# Patient Record
Sex: Female | Born: 1980 | Race: Black or African American | Hispanic: No | Marital: Single | Smoking: Former smoker
Health system: Southern US, Community
[De-identification: ages and names within clinical notes are randomized; demographics above are authoritative.]

## PROBLEM LIST (undated history)

## (undated) DIAGNOSIS — I10 Essential (primary) hypertension: Secondary | ICD-10-CM

## (undated) DIAGNOSIS — K5732 Diverticulitis of large intestine without perforation or abscess without bleeding: Secondary | ICD-10-CM

## (undated) DIAGNOSIS — K5720 Diverticulitis of large intestine with perforation and abscess without bleeding: Secondary | ICD-10-CM

## (undated) DIAGNOSIS — K5792 Diverticulitis of intestine, part unspecified, without perforation or abscess without bleeding: Secondary | ICD-10-CM

## (undated) HISTORY — DX: Diverticulitis of large intestine with perforation and abscess without bleeding: K57.20

## (undated) HISTORY — DX: Diverticulitis of intestine, part unspecified, without perforation or abscess without bleeding: K57.92

## (undated) HISTORY — DX: Diverticulitis of large intestine without perforation or abscess without bleeding: K57.32

## (undated) HISTORY — PX: OTHER SURGICAL HISTORY: SHX169

---

## 2017-03-23 DIAGNOSIS — K5792 Diverticulitis of intestine, part unspecified, without perforation or abscess without bleeding: Secondary | ICD-10-CM

## 2017-03-23 HISTORY — DX: Diverticulitis of intestine, part unspecified, without perforation or abscess without bleeding: K57.92

## 2017-04-24 ENCOUNTER — Encounter: Payer: Self-pay | Admitting: Emergency Medicine

## 2017-04-24 ENCOUNTER — Other Ambulatory Visit: Payer: Self-pay

## 2017-04-24 ENCOUNTER — Emergency Department
Admission: EM | Admit: 2017-04-24 | Discharge: 2017-04-24 | Disposition: A | Discharge status: Home / Self Care | Payer: Medicaid Other | Attending: Emergency Medicine | Admitting: Emergency Medicine

## 2017-04-24 ENCOUNTER — Emergency Department: Payer: Medicaid Other

## 2017-04-24 DIAGNOSIS — I10 Essential (primary) hypertension: Secondary | ICD-10-CM | POA: Insufficient documentation

## 2017-04-24 DIAGNOSIS — R42 Dizziness and giddiness: Secondary | ICD-10-CM

## 2017-04-24 DIAGNOSIS — F17210 Nicotine dependence, cigarettes, uncomplicated: Secondary | ICD-10-CM | POA: Diagnosis not present

## 2017-04-24 DIAGNOSIS — Z79899 Other long term (current) drug therapy: Secondary | ICD-10-CM | POA: Insufficient documentation

## 2017-04-24 LAB — BASIC METABOLIC PANEL
ANION GAP: 9 (ref 5–15)
BUN: 16 mg/dL (ref 6–20)
CALCIUM: 9 mg/dL (ref 8.9–10.3)
CHLORIDE: 104 mmol/L (ref 101–111)
CO2: 23 mmol/L (ref 22–32)
Creatinine, Ser: 0.88 mg/dL (ref 0.44–1.00)
GFR calc non Af Amer: >60 mL/min (ref >60)
GLUCOSE: 118 mg/dL — AB (ref 65–99)
POTASSIUM: 3.9 mmol/L (ref 3.5–5.1)
Sodium: 136 mmol/L (ref 135–145)

## 2017-04-24 LAB — CBC
HEMATOCRIT: 44.5 % (ref 35.0–47.0)
HEMOGLOBIN: 14.6 g/dL (ref 12.0–16.0)
MCH: 29.4 pg (ref 26.0–34.0)
MCHC: 32.8 g/dL (ref 32.0–36.0)
MCV: 89.5 fL (ref 80.0–100.0)
Platelets: 272 10*3/uL (ref 150–440)
RBC: 4.97 MIL/uL (ref 3.80–5.20)
RDW: 13.6 % (ref 11.5–14.5)
WBC: 6.8 10*3/uL (ref 3.6–11.0)

## 2017-04-24 MED ORDER — AMLODIPINE BESYLATE 5 MG PO TABS
5.0000 mg | ORAL_TABLET | Freq: Once | ORAL | Status: AC
  Administered 2017-04-24: 5 mg via ORAL
  Filled 2017-04-24: qty 1

## 2017-04-24 MED ORDER — HYDROCHLOROTHIAZIDE 25 MG PO TABS: 25.0000 mg | ORAL_TABLET | Freq: Every day | ORAL | 1 refills | Status: DC

## 2017-04-24 MED ORDER — HYDROCHLOROTHIAZIDE 25 MG PO TABS
25.0000 mg | ORAL_TABLET | Freq: Once | ORAL | Status: AC
  Administered 2017-04-24: 25 mg via ORAL
  Filled 2017-04-24 (×2): qty 1

## 2017-04-24 MED ORDER — AMLODIPINE BESYLATE 5 MG PO TABS: 5.0000 mg | ORAL_TABLET | Freq: Every day | ORAL | 1 refills | Status: DC

## 2017-04-24 NOTE — ED Provider Notes (Signed)
[facility] Emergency Department Provider Note   ____________________________________________    I have reviewed the triage vital signs and the nursing notes.   HISTORY  Chief Complaint Dizziness     HPI Meredith Myers is a 37 y.o. female who presents with complaints of dizziness.  Patient reports she has felt mildly lightheaded over the past 4 days.  She denies palpitations or chest pain.  No shortness of breath.  Intermittent mild headaches.  No neuro deficits.  No fevers or chills.  No calf pain or swelling.  No recent travel.  No pleurisy.  No history of arrhythmia.   History reviewed. No pertinent past medical history.  There are no active problems to display for this patient.   Past Surgical History:  Procedure Laterality Date  . ablation of uterus      Prior to Admission medications   Medication Sig Start Date End Date Taking? Authorizing Provider  amLODipine (NORVASC) 5 MG tablet Take 1 tablet (5 mg total) by mouth daily. 04/24/17 06/23/17  Jene Every, MD  hydrochlorothiazide (HYDRODIURIL) 25 MG tablet Take 1 tablet (25 mg total) by mouth daily. 04/24/17   Jene Every, MD     Allergies Patient has no known allergies.  History reviewed. No pertinent family history.  Social History Social History   Tobacco Use  . Smoking status: Current Every Day Smoker  . Smokeless tobacco: Never Used  Substance Use Topics  . Alcohol use: No    Frequency: Never  . Drug use: No    Review of Systems  Constitutional: No fevers, dizziness as above Eyes: No visual changes.  ENT: No sore throat. Cardiovascular: Denies chest pain. Respiratory: Denies shortness of breath. Gastrointestinal  No nausea, no vomiting.   Genitourinary: Negative for dysuria. Musculoskeletal: Negative for back pain. Skin: Negative for rash. Neurological: Mild headache, no weakness   ____________________________________________   PHYSICAL EXAM:  VITAL  SIGNS: ED Triage Vitals  Enc Vitals Group     BP 04/24/17 1701 (!) 176/107     Pulse Rate 04/24/17 1701 99     Resp 04/24/17 1701 18     Temp 04/24/17 1701 98.7 F (37.1 C)     Temp Source 04/24/17 1701 Oral     SpO2 04/24/17 1701 97 %     Weight 04/24/17 1702 129.3 kg (285 lb)     Height 04/24/17 1702 1.6 m (5\' 3" )     Head Circumference --      Peak Flow --      Pain Score 04/24/17 2018 6     Pain Loc --      Pain Edu? --      Excl. in GC? --     Constitutional: Alert and oriented. No acute distress. Pleasant and interactive Eyes: Conjunctivae are normal.  PERRLA Nose: No congestion/rhinnorhea. Mouth/Throat: Mucous membranes are moist.    Cardiovascular: Normal rate, regular rhythm. Grossly normal heart sounds.  Good peripheral circulation. Respiratory: Normal respiratory effort.  No retractions. Lungs CTAB. Gastrointestinal: Soft and nontender. No distention. . Genitourinary: deferred Musculoskeletal:   Warm and well perfused Neurologic:  Normal speech and language. No gross focal neurologic deficits are appreciated.  Skin:  Skin is warm, dry and intact. No rash noted. Psychiatric: Mood and affect are normal. Speech and behavior are normal.  ____________________________________________   LABS (all labs ordered are listed, but only abnormal results are displayed)  Labs Reviewed  BASIC METABOLIC PANEL - Abnormal; Notable for the following components:  Result Value   Glucose, Bld 118 (*)    All other components within normal limits  CBC  URINALYSIS, COMPLETE (UACMP) WITH MICROSCOPIC  POC URINE PREG, ED   ____________________________________________  EKG  ED ECG REPORT I, Jene Everyobert Renly Guedes, the attending physician, personally viewed and interpreted this ECG.  Date: 04/24/2017  Rhythm: normal sinus rhythm QRS Axis: normal Intervals: normal ST/T Wave abnormalities: normal Narrative Interpretation: no evidence of acute  ischemia  ____________________________________________  RADIOLOGY  CT head unremarkable ____________________________________________   PROCEDURES  Procedure(s) performed: No  Procedures   Critical Care performed: No ____________________________________________   INITIAL IMPRESSION / ASSESSMENT AND PLAN / ED COURSE  Pertinent labs & imaging results that were available during my care of the patient were reviewed by me and considered in my medical decision making (see chart for details).  Patient well-appearing in no acute distress.  EKG unremarkable.  Labs are unremarkable.  Elevated blood pressure may be causing some of her symptoms.  Patient does not have a history of high blood pressure.  We will start the patient on dual therapy for her blood pressure and have her follow-up closely with PCP for adjustment/further evaluation.    ____________________________________________   FINAL CLINICAL IMPRESSION(S) / ED DIAGNOSES  Final diagnoses:  Dizziness  Essential hypertension        Note:  This document was prepared using Dragon voice recognition software and may include unintentional dictation errors.    Jene EveryKinner, Micki Cassel, MD 04/24/17 [phone]

## 2017-04-24 NOTE — ED Triage Notes (Signed)
Acute onset of feeling lightheaded and headache 3 days and has not went completely away.  Feels increase in lightheaded when stands. No pain other than headache which is to posterior of head.  Took motrin and some relief but comes back.

## 2017-04-24 NOTE — ED Notes (Signed)
ED Provider at bedside. 

## 2017-05-04 ENCOUNTER — Other Ambulatory Visit: Payer: Self-pay

## 2017-05-04 ENCOUNTER — Inpatient Hospital Stay
Admission: EM | Admit: 2017-05-04 | Discharge: 2017-05-07 | DRG: 392 | Disposition: A | Discharge status: Home / Self Care | Payer: Medicaid Other | Attending: Surgery | Admitting: Surgery

## 2017-05-04 ENCOUNTER — Emergency Department: Payer: Medicaid Other

## 2017-05-04 ENCOUNTER — Encounter: Payer: Self-pay | Admitting: Emergency Medicine

## 2017-05-04 DIAGNOSIS — R109 Unspecified abdominal pain: Secondary | ICD-10-CM

## 2017-05-04 DIAGNOSIS — K5732 Diverticulitis of large intestine without perforation or abscess without bleeding: Secondary | ICD-10-CM | POA: Diagnosis not present

## 2017-05-04 DIAGNOSIS — K5792 Diverticulitis of intestine, part unspecified, without perforation or abscess without bleeding: Secondary | ICD-10-CM | POA: Diagnosis not present

## 2017-05-04 DIAGNOSIS — I10 Essential (primary) hypertension: Secondary | ICD-10-CM | POA: Diagnosis present

## 2017-05-04 DIAGNOSIS — Z79899 Other long term (current) drug therapy: Secondary | ICD-10-CM

## 2017-05-04 DIAGNOSIS — Z87891 Personal history of nicotine dependence: Secondary | ICD-10-CM | POA: Diagnosis not present

## 2017-05-04 HISTORY — DX: Essential (primary) hypertension: I10

## 2017-05-04 LAB — URINALYSIS, COMPLETE (UACMP) WITH MICROSCOPIC
BACTERIA UA: NONE SEEN
Bilirubin Urine: NEGATIVE
Glucose, UA: NEGATIVE mg/dL
HGB URINE DIPSTICK: NEGATIVE
KETONES UR: 5 mg/dL — AB
Nitrite: NEGATIVE
PROTEIN: 30 mg/dL — AB
Specific Gravity, Urine: 1.015 (ref 1.005–1.030)
pH: 5 (ref 5.0–8.0)

## 2017-05-04 LAB — COMPREHENSIVE METABOLIC PANEL
ALBUMIN: 4.1 g/dL (ref 3.5–5.0)
ALT: 15 U/L (ref 14–54)
ANION GAP: 11 (ref 5–15)
AST: 21 U/L (ref 15–41)
Alkaline Phosphatase: 56 U/L (ref 38–126)
BUN: 11 mg/dL (ref 6–20)
CHLORIDE: 96 mmol/L — AB (ref 101–111)
CO2: 27 mmol/L (ref 22–32)
Calcium: 9.9 mg/dL (ref 8.9–10.3)
Creatinine, Ser: 1.33 mg/dL — ABNORMAL HIGH (ref 0.44–1.00)
GFR calc Af Amer: 59 mL/min — ABNORMAL LOW (ref >60)
GFR calc non Af Amer: 51 mL/min — ABNORMAL LOW (ref >60)
GLUCOSE: 131 mg/dL — AB (ref 65–99)
POTASSIUM: 3.5 mmol/L (ref 3.5–5.1)
SODIUM: 134 mmol/L — AB (ref 135–145)
Total Bilirubin: 1 mg/dL (ref 0.3–1.2)
Total Protein: 8.9 g/dL — ABNORMAL HIGH (ref 6.5–8.1)

## 2017-05-04 LAB — CBC
HEMATOCRIT: 47.1 % — AB (ref 35.0–47.0)
HEMOGLOBIN: 15.5 g/dL (ref 12.0–16.0)
MCH: 29.1 pg (ref 26.0–34.0)
MCHC: 32.8 g/dL (ref 32.0–36.0)
MCV: 88.6 fL (ref 80.0–100.0)
Platelets: 312 10*3/uL (ref 150–440)
RBC: 5.32 MIL/uL — ABNORMAL HIGH (ref 3.80–5.20)
RDW: 13.5 % (ref 11.5–14.5)
WBC: 15 10*3/uL — ABNORMAL HIGH (ref 3.6–11.0)

## 2017-05-04 LAB — WET PREP, GENITAL
Sperm: NONE SEEN
Trich, Wet Prep: NONE SEEN
Yeast Wet Prep HPF POC: NONE SEEN

## 2017-05-04 LAB — CHLAMYDIA/NGC RT PCR (ARMC ONLY)
CHLAMYDIA TR: NOT DETECTED
N GONORRHOEAE: NOT DETECTED

## 2017-05-04 LAB — POCT PREGNANCY, URINE: PREG TEST UR: NEGATIVE

## 2017-05-04 LAB — LIPASE, BLOOD: LIPASE: 22 U/L (ref 11–51)

## 2017-05-04 MED ORDER — PIPERACILLIN-TAZOBACTAM 3.375 G IVPB 30 MIN
3.3750 g | Freq: Once | INTRAVENOUS | Status: AC
  Administered 2017-05-04: 3.375 g via INTRAVENOUS

## 2017-05-04 MED ORDER — KETOROLAC TROMETHAMINE 30 MG/ML IJ SOLN
INTRAMUSCULAR | Status: AC
  Administered 2017-05-04: 30 mg via INTRAVENOUS
  Filled 2017-05-04: qty 1

## 2017-05-04 MED ORDER — ENOXAPARIN SODIUM 40 MG/0.4ML ~~LOC~~ SOLN
40.0000 mg | Freq: Two times a day (BID) | SUBCUTANEOUS | Status: DC
  Administered 2017-05-04 – 2017-05-05 (×2): 40 mg via SUBCUTANEOUS
  Filled 2017-05-04 (×4): qty 0.4

## 2017-05-04 MED ORDER — ONDANSETRON HCL 4 MG/2ML IJ SOLN
4.0000 mg | Freq: Once | INTRAMUSCULAR | Status: AC
  Administered 2017-05-04: 4 mg via INTRAVENOUS

## 2017-05-04 MED ORDER — MORPHINE SULFATE (PF) 4 MG/ML IV SOLN
INTRAVENOUS | Status: AC
  Administered 2017-05-04: 4 mg via INTRAVENOUS
  Filled 2017-05-04: qty 1

## 2017-05-04 MED ORDER — PIPERACILLIN-TAZOBACTAM 3.375 G IVPB
INTRAVENOUS | Status: AC
  Administered 2017-05-04: 3.375 g via INTRAVENOUS
  Filled 2017-05-04: qty 50

## 2017-05-04 MED ORDER — HYDRALAZINE HCL 20 MG/ML IJ SOLN: 10.0000 mg | Freq: Four times a day (QID) | INTRAMUSCULAR | Status: DC | PRN

## 2017-05-04 MED ORDER — KETOROLAC TROMETHAMINE 30 MG/ML IJ SOLN
30.0000 mg | Freq: Four times a day (QID) | INTRAMUSCULAR | Status: DC
  Administered 2017-05-04 – 2017-05-07 (×12): 30 mg via INTRAVENOUS
  Filled 2017-05-04 (×10): qty 1

## 2017-05-04 MED ORDER — HYDROMORPHONE HCL 1 MG/ML IJ SOLN
0.5000 mg | INTRAMUSCULAR | Status: DC | PRN
  Administered 2017-05-04 – 2017-05-05 (×2): 0.5 mg via INTRAVENOUS
  Filled 2017-05-04 (×3): qty 0.5

## 2017-05-04 MED ORDER — LACTATED RINGERS IV SOLN
125.0000 mL/h | INTRAVENOUS | Status: DC
  Administered 2017-05-05 – 2017-05-06 (×3): 125 mL/h via INTRAVENOUS

## 2017-05-04 MED ORDER — SODIUM CHLORIDE 0.9 % IV BOLUS (SEPSIS)
1000.0000 mL | Freq: Once | INTRAVENOUS | Status: AC
  Administered 2017-05-04: 1000 mL via INTRAVENOUS

## 2017-05-04 MED ORDER — MORPHINE SULFATE (PF) 4 MG/ML IV SOLN
4.0000 mg | Freq: Once | INTRAVENOUS | Status: AC
  Administered 2017-05-04: 4 mg via INTRAVENOUS

## 2017-05-04 MED ORDER — IOPAMIDOL (ISOVUE-300) INJECTION 61%
100.0000 mL | Freq: Once | INTRAVENOUS | Status: AC | PRN
  Administered 2017-05-04: 100 mL via INTRAVENOUS
  Filled 2017-05-04: qty 100

## 2017-05-04 MED ORDER — PIPERACILLIN-TAZOBACTAM 3.375 G IVPB
3.3750 g | Freq: Three times a day (TID) | INTRAVENOUS | Status: DC
  Administered 2017-05-05 – 2017-05-06 (×4): 3.375 g via INTRAVENOUS
  Filled 2017-05-04 (×4): qty 50

## 2017-05-04 MED ORDER — ONDANSETRON HCL 4 MG/2ML IJ SOLN
INTRAMUSCULAR | Status: AC
  Administered 2017-05-04: 4 mg via INTRAVENOUS
  Filled 2017-05-04: qty 2

## 2017-05-04 MED ORDER — ONDANSETRON 4 MG PO TBDP: 4.0000 mg | ORAL_TABLET | Freq: Four times a day (QID) | ORAL | Status: DC | PRN

## 2017-05-04 MED ORDER — ONDANSETRON HCL 4 MG/2ML IJ SOLN: 4.0000 mg | Freq: Four times a day (QID) | INTRAMUSCULAR | Status: DC | PRN

## 2017-05-04 MED ORDER — IOPAMIDOL (ISOVUE-300) INJECTION 61%
30.0000 mL | Freq: Once | INTRAVENOUS | Status: AC
  Administered 2017-05-04: 30 mL via ORAL
  Filled 2017-05-04: qty 30

## 2017-05-04 MED ORDER — PANTOPRAZOLE SODIUM 40 MG IV SOLR
40.0000 mg | Freq: Every day | INTRAVENOUS | Status: DC
  Administered 2017-05-04 – 2017-05-06 (×3): 40 mg via INTRAVENOUS
  Filled 2017-05-04 (×4): qty 40

## 2017-05-04 NOTE — ED Provider Notes (Signed)
CT results IMPRESSION: 1. Acute proximal sigmoid diverticulitis with extensive surrounding inflammatory stranding. There is a small (0.4 cubic cm) collection of gas along the anterior inferior margin of the involved segment which could be within an inflamed diverticulum but could also be a small contained extraluminal collection of gas. There is no overt free intraperitoneal gas.  Patient has sigmoid diverticulitis with possible focal perforation.  I have ordered IV Zosyn and will discuss with general surgeon for admission.    Emily FilbertWilliams, Jonathan E, MD 05/04/17 [phone]

## 2017-05-04 NOTE — ED Provider Notes (Signed)
[facility] Emergency Department Provider Note ____________________________________________   I have reviewed the triage vital signs and the triage nursing note.  HISTORY  Chief Complaint Abdominal Pain   Historian Patient  HPI Meredith Myers is a 37 y.o. female presenting with lower abdominal pain, although it also feels diffuse.  Somewhat more so in the suprapubic area without specific dysuria or hematuria.  Mild nausea without vomiting.  patient diarrhea black or bloody stools.  No chest pain or trouble breathing.  No fevers.  Pain is moderate to severe.  Nothing makes it worse or better.  Pain is actually been there mildly progressing for about a week or so.  History of endometrial ablation.  Denies vaginal discharge.   Past Medical History:  Diagnosis Date  . Hypertension     There are no active problems to display for this patient.   Past Surgical History:  Procedure Laterality Date  . ablation of uterus      Prior to Admission medications   Medication Sig Start Date End Date Taking? Authorizing Provider  amLODipine (NORVASC) 5 MG tablet Take 1 tablet (5 mg total) by mouth daily. 04/24/17 06/23/17  Jene EveryKinner, Robert, MD  hydrochlorothiazide (HYDRODIURIL) 25 MG tablet Take 1 tablet (25 mg total) by mouth daily. 04/24/17   Jene EveryKinner, Robert, MD    No Known Allergies  No family history on file.  Social History Social History   Tobacco Use  . Smoking status: Former Smoker    Types: Cigarettes  . Smokeless tobacco: Never Used  Substance Use Topics  . Alcohol use: No    Frequency: Never  . Drug use: No    Review of Systems  Constitutional: Negative for fever. Eyes: Negative for visual changes. ENT: Negative for sore throat. Cardiovascular: Negative for chest pain. Respiratory: Negative for shortness of breath. Gastrointestinal: Positive for abdominal pain as per HPI. Genitourinary: Negative for dysuria. Musculoskeletal: Negative for  back pain. Skin: Negative for rash. Neurological: Negative for headache.  ____________________________________________   PHYSICAL EXAM:  VITAL SIGNS: ED Triage Vitals  Enc Vitals Group     BP 05/04/17 1307 126/87     Pulse Rate 05/04/17 1307 (!) 124     Resp 05/04/17 1307 18     Temp 05/04/17 1307 98.1 F (36.7 C)     Temp Source 05/04/17 1307 Oral     SpO2 05/04/17 1307 98 %     Weight 05/04/17 1312 285 lb (129.3 kg)     Height 05/04/17 1312 5\' 3"  (1.6 m)     Head Circumference --      Peak Flow --      Pain Score 05/04/17 1312 7     Pain Loc --      Pain Edu? --      Excl. in GC? --      Constitutional: Alert and oriented. Well appearing and in no distress. HEENT   Head: Normocephalic and atraumatic.      Eyes: Conjunctivae are normal. Pupils equal and round.       Ears:         Nose: No congestion/rhinnorhea.   Mouth/Throat: Mucous membranes are moist.   Neck: No stridor. Cardiovascular/Chest: Normal rate, regular rhythm.  No murmurs, rubs, or gallops. Respiratory: Normal respiratory effort without tachypnea nor retractions. Breath sounds are clear and equal bilaterally. No wheezes/rales/rhonchi. Gastrointestinal: Soft. No distention, no guarding, no rebound.  Acutely tender lower greater than upper without focal McBurney's point tenderness. Genitourinary/rectal:  White vaginal discharge  without cervical motion tenderness or adnexal mass or tenderness. Musculoskeletal: Nontender with normal range of motion in all extremities. No joint effusions.  No lower extremity tenderness.  No edema. Neurologic:  Normal speech and language. No gross or focal neurologic deficits are appreciated. Skin:  Skin is warm, dry and intact. No rash noted. Psychiatric: Mood and affect are normal. Speech and behavior are normal. Patient exhibits appropriate insight and judgment.   ____________________________________________  LABS (pertinent positives/negatives) I, Governor Rooks,  MD the attending physician have reviewed the labs noted below.  Labs Reviewed  COMPREHENSIVE METABOLIC PANEL - Abnormal; Notable for the following components:      Result Value   Sodium 134 (*)    Chloride 96 (*)    Glucose, Bld 131 (*)    Creatinine, Ser 1.33 (*)    Total Protein 8.9 (*)    GFR calc non Af Amer 51 (*)    GFR calc Af Amer 59 (*)    All other components within normal limits  CBC - Abnormal; Notable for the following components:   WBC 15.0 (*)    RBC 5.32 (*)    HCT 47.1 (*)    All other components within normal limits  URINALYSIS, COMPLETE (UACMP) WITH MICROSCOPIC - Abnormal; Notable for the following components:   Color, Urine AMBER (*)    APPearance HAZY (*)    Ketones, ur 5 (*)    Protein, ur 30 (*)    Leukocytes, UA TRACE (*)    Squamous Epithelial / LPF 0-5 (*)    All other components within normal limits  CHLAMYDIA/NGC RT PCR ([facility] ONLY)  WET PREP, GENITAL  LIPASE, BLOOD  POC URINE PREG, ED  POCT PREGNANCY, URINE    ____________________________________________  RADIOLOGY All Xrays were viewed by me.  Imaging interpreted by Radiologist, and I, Governor Rooks, MD the attending physician have reviewed the radiologist interpretation noted below.  CT abdomen pelvis with contrast: Pending __________________________________________  PROCEDURES  Procedure(s) performed: None  Critical Care performed: None   ____________________________________________  ED COURSE / ASSESSMENT AND PLAN  Pertinent labs & imaging results that were available during my care of the patient were reviewed by me and considered in my medical decision making (see chart for details).    Patient looks quite uncomfortable and does have elevated white blood cell count, lower greater than upper abdominal pain, will CT for further investigation.  We will Myers with symptomatic medication with IV fluids as well as pain and nausea medication.  Pelvic exam not consistent  with PID or cervicitis, however she does have vaginal discharge, sent for wet prep and GC chlamydia.  Patient care transferred to Dr. Mayford Knife at shift change 5 PM.  Disposition pending CT scan result, as well as wet prep result.  DIFFERENTIAL DIAGNOSIS: Differential diagnosis includes, but is not limited to, ovarian cyst, ovarian torsion, acute appendicitis, diverticulitis, urinary tract infection/pyelonephritis, endometriosis, bowel obstruction, colitis, renal colic, gastroenteritis, hernia, fibroids, endometriosis, pregnancy related pain including ectopic pregnancy, etc.   CONSULTATIONS: None   Patient / Family / Caregiver informed of clinical course, medical decision-making process, and agree with plan.    ___________________________________________   FINAL CLINICAL IMPRESSION(S) / ED DIAGNOSES   Final diagnoses:  Abdominal pain, unspecified abdominal location      ___________________________________________        Note: This dictation was prepared with Dragon dictation. Any transcriptional errors that result from this process are unintentional    Governor Rooks, MD 05/04/17 1658

## 2017-05-04 NOTE — ED Triage Notes (Signed)
Pt in via POV with complaints of pelvic pain since Sunday, pt reports feeling nauseated, denies any vomiting, diarrhea.  Pt tachycardic upon arrival, other vitals WDL, NAD noted at this time.

## 2017-05-04 NOTE — ED Notes (Addendum)
Pt reports dizziness, lower abdominal pain, nausea since Sunday 2/2. Pt started on new BP medication on Sunday. Pt denies syncope. Appears pale. Denies vomiting or diarrhea. Pt has not noticed any bleeding.

## 2017-05-04 NOTE — Progress Notes (Signed)
Anticoagulation monitoring(Lovenox):  37 yo female ordered Lovenox 40 mg Q24h  Filed Weights   05/04/17 1312  Weight: 285 lb (129.3 kg)   BMI 50.5    Lab Results  Component Value Date   CREATININE 1.33 (H) 05/04/2017   CREATININE 0.88 04/24/2017   Estimated Creatinine Clearance: 76.8 mL/min (A) (by C-G formula based on SCr of 1.33 mg/dL (H)). Hemoglobin & Hematocrit     Component Value Date/Time   HGB 15.5 05/04/2017 1309   HCT 47.1 (H) 05/04/2017 1309     Per Protocol for Patient with estCrcl > 30 ml/min and BMI > 40, will transition to Lovenox 40 mg Q12h.

## 2017-05-04 NOTE — H&P (Signed)
Date of Admission:  05/04/2017  Reason for Admission: Acute diverticulitis  History of Present Illness: Meredith Myers is a 37 y.o. female who presents with a 3-day history of low abdominal pain.  Patient reports that her pain started Sunday night very acute and suddenly with lower abdominal pain while she was at work.  Pain did not improve.  She initially thought that she was constipated and needed to have a bowel movement but this did not help with the pain.  The next day her symptoms continue to worsen and she try to sleep them off but that did not help either.  Today her pain continued to worsen and be more unbearable she presented to the emergency room.  Denies having any fevers or chills.  Reports having nausea but no emesis.  Reports having some mild dizziness and initially was not sure if this was related to blood pressure medications or not.  Reports her pain is in the low abdomen.  She has not had episodes like this in the past.  Denies any chest pain or shortness of breath.  Denies any blood in her stool.  Of note she recently presented to the emergency room on 2/2 with dizziness and was not found to have hypertension.  She was started on hydrochlorothiazide and amlodipine.  Past Medical History: Past Medical History:  Diagnosis Date  . Hypertension      Past Surgical History: Past Surgical History:  Procedure Laterality Date  . ablation of uterus      Home Medications: Prior to Admission medications   Medication Sig Start Date End Date Taking? Authorizing Provider  amLODipine (NORVASC) 5 MG tablet Take 1 tablet (5 mg total) by mouth daily. 04/24/17 06/23/17 Yes Jene Every, MD  hydrochlorothiazide (HYDRODIURIL) 25 MG tablet Take 1 tablet (25 mg total) by mouth daily. 04/24/17  Yes Jene Every, MD    Allergies: No Known Allergies  Social History:  reports that she has quit smoking. Her smoking use included cigarettes. she has never used smokeless tobacco. She reports that  she does not drink alcohol or use drugs.   Family History: No family history of diverticulitis  Review of Systems: Review of Systems  Constitutional: Negative for chills and fever.  HENT: Negative for hearing loss.   Respiratory: Negative for shortness of breath.   Cardiovascular: Negative for chest pain.  Gastrointestinal: Positive for abdominal pain and nausea. Negative for blood in stool, constipation, diarrhea and vomiting.  Genitourinary: Negative for dysuria.  Musculoskeletal: Negative for myalgias.  Skin: Negative for rash.  Neurological: Positive for dizziness.  Psychiatric/Behavioral: Negative for depression.  All other systems reviewed and are negative.   Physical Exam BP 127/84 (BP Location: Right Arm)   Pulse (!) 102   Temp 98.6 F (37 C) (Oral)   Resp 18   Ht 5\' 3"  (1.6 m)   Wt 129.3 kg (285 lb)   SpO2 98%   BMI 50.49 kg/m  CONSTITUTIONAL: No acute distress HEENT:  Normocephalic, atraumatic, extraocular motion intact. NECK: Trachea is midline, and there is no jugular venous distension.  RESPIRATORY:  Lungs are clear, and breath sounds are equal bilaterally. Normal respiratory effort without pathologic use of accessory muscles. CARDIOVASCULAR: Heart is regular without murmurs, gallops, or rubs. GI: The abdomen is soft, nondistended, obese, with tenderness to palpation in the mid abdomen and lower abdomen.  No peritoneal signs.  MUSCULOSKELETAL:  Normal muscle strength and tone in all four extremities.  No peripheral edema or cyanosis. SKIN: Skin turgor is  normal. There are no pathologic skin lesions.  NEUROLOGIC:  Motor and sensation is grossly normal.  Cranial nerves are grossly intact. PSYCH:  Alert and oriented to person, place and time. Affect is normal.  Laboratory Analysis: Results for orders placed or performed during the hospital encounter of 05/04/17 (from the past 24 hour(s))  Lipase, blood     Status: None   Collection Time: 05/04/17  1:09 PM   Result Value Ref Range   Lipase 22 11 - 51 U/L  Comprehensive metabolic panel     Status: Abnormal   Collection Time: 05/04/17  1:09 PM  Result Value Ref Range   Sodium 134 (L) 135 - 145 mmol/L   Potassium 3.5 3.5 - 5.1 mmol/L   Chloride 96 (L) 101 - 111 mmol/L   CO2 27 22 - 32 mmol/L   Glucose, Bld 131 (H) 65 - 99 mg/dL   BUN 11 6 - 20 mg/dL   Creatinine, Ser 0.981.33 (H) 0.44 - 1.00 mg/dL   Calcium 9.9 8.9 - 11.910.3 mg/dL   Total Protein 8.9 (H) 6.5 - 8.1 g/dL   Albumin 4.1 3.5 - 5.0 g/dL   AST 21 15 - 41 U/L   ALT 15 14 - 54 U/L   Alkaline Phosphatase 56 38 - 126 U/L   Total Bilirubin 1.0 0.3 - 1.2 mg/dL   GFR calc non Af Amer 51 (L) >60 mL/min   GFR calc Af Amer 59 (L) >60 mL/min   Anion gap 11 5 - 15  CBC     Status: Abnormal   Collection Time: 05/04/17  1:09 PM  Result Value Ref Range   WBC 15.0 (H) 3.6 - 11.0 K/uL   RBC 5.32 (H) 3.80 - 5.20 MIL/uL   Hemoglobin 15.5 12.0 - 16.0 g/dL   HCT 14.747.1 (H) 82.935.0 - 56.247.0 %   MCV 88.6 80.0 - 100.0 fL   MCH 29.1 26.0 - 34.0 pg   MCHC 32.8 32.0 - 36.0 g/dL   RDW 13.013.5 86.511.5 - 78.414.5 %   Platelets 312 150 - 440 K/uL  Urinalysis, Complete w Microscopic     Status: Abnormal   Collection Time: 05/04/17  1:09 PM  Result Value Ref Range   Color, Urine AMBER (A) YELLOW   APPearance HAZY (A) CLEAR   Specific Gravity, Urine 1.015 1.005 - 1.030   pH 5.0 5.0 - 8.0   Glucose, UA NEGATIVE NEGATIVE mg/dL   Hgb urine dipstick NEGATIVE NEGATIVE   Bilirubin Urine NEGATIVE NEGATIVE   Ketones, ur 5 (A) NEGATIVE mg/dL   Protein, ur 30 (A) NEGATIVE mg/dL   Nitrite NEGATIVE NEGATIVE   Leukocytes, UA TRACE (A) NEGATIVE   RBC / HPF 0-5 0 - 5 RBC/hpf   WBC, UA 0-5 0 - 5 WBC/hpf   Bacteria, UA NONE SEEN NONE SEEN   Squamous Epithelial / LPF 0-5 (A) NONE SEEN   Mucus PRESENT   Pregnancy, urine POC     Status: None   Collection Time: 05/04/17  1:28 PM  Result Value Ref Range   Preg Test, Ur NEGATIVE NEGATIVE  Chlamydia/NGC rt PCR     Status: None    Collection Time: 05/04/17  4:30 PM  Result Value Ref Range   Specimen source GC/Chlam ENDOCERVICAL    Chlamydia Tr NOT DETECTED NOT DETECTED   N gonorrhoeae NOT DETECTED NOT DETECTED  Wet prep, genital     Status: Abnormal   Collection Time: 05/04/17  4:30 PM  Result Value Ref Range  Yeast Wet Prep HPF POC NONE SEEN NONE SEEN   Trich, Wet Prep NONE SEEN NONE SEEN   Clue Cells Wet Prep HPF POC PRESENT (A) NONE SEEN   WBC, Wet Prep HPF POC MANY (A) NONE SEEN   Sperm NONE SEEN     Imaging: Ct Abdomen Pelvis W Contrast  Result Date: 05/04/2017 CLINICAL DATA:  Dizziness, lower abdominal pain, and nausea. Pallor. EXAM: CT ABDOMEN AND PELVIS WITH CONTRAST TECHNIQUE: Multidetector CT imaging of the abdomen and pelvis was performed using the standard protocol following bolus administration of intravenous contrast. CONTRAST:  ISOVUE-300 IOPAMIDOL (ISOVUE-300) INJECTION 61% COMPARISON:  None. FINDINGS: Lower chest: Unremarkable Hepatobiliary: Unremarkable Pancreas: Unremarkable Spleen: Unremarkable Adrenals/Urinary Tract: Unremarkable Stomach/Bowel: Acute diverticulitis involving the proximal sigmoid colon in the lower anterior midline of the pelvis on image 70/2, with a 1.8 by 0.6 by 0.7 cm (volume = 0.4 cm^3) collection of gas along the anterior inferior margin of the bowel which could conceivably be within a large inflamed diverticulum but which may be contained extraluminal gas. There is no free intraperitoneal gas observed. Considerable surrounding inflammatory stranding. Appendix normal. Vascular/Lymphatic: Unremarkable Reproductive: Essure type devices are present bilaterally. There may also be a separate IUD. Other: No supplemental non-categorized findings. Musculoskeletal: Mild disc bulges at L4-5 and L5-S1. IMPRESSION: 1. Acute proximal sigmoid diverticulitis with extensive surrounding inflammatory stranding. There is a small (0.4 cubic cm) collection of gas along the anterior inferior margin  of the involved segment which could be within an inflamed diverticulum but could also be a small contained extraluminal collection of gas. There is no overt free intraperitoneal gas. Electronically Signed   By: Gaylyn Rong M.D.   On: 05/04/2017 17:08    Assessment and Plan: This is a 37 y.o. female who presents with acute diverticulitis.  I have independently reviewed the patient's imaging study as well as reviewed her laboratory studies.  Her CT scan does show sigmoid diverticulitis with a good amount of surrounding stranding.  I do not think that she has an area of contained perforation as this may just be a diverticulum.  Her white blood cell count is elevated at 15.  Discussed with the patient she will be admitted to the general surgery team.  She will be n.p.o. with IV fluid hydration and will be started on IV antibiotics.  Discussed with the patient that this point will start conservative management for diverticulitis and looking at her CT scan and symptoms, there is a more likelihood that she may be able to continue with conservative management and be discharged home in a few days.  However she does understand that if there is no improvement or worsening, that she may require surgery during this admission.  At this point will assess her every day and determine her needs at that point.  She will have appropriate nausea and pain control.  She understands this plan and all of her questions have been answered    Howie Ill, MD [facility]

## 2017-05-04 NOTE — ED Notes (Signed)
Pt back from CT

## 2017-05-05 DIAGNOSIS — K5732 Diverticulitis of large intestine without perforation or abscess without bleeding: Principal | ICD-10-CM

## 2017-05-05 LAB — BASIC METABOLIC PANEL
Anion gap: 9 (ref 5–15)
BUN: 14 mg/dL (ref 6–20)
CO2: 25 mmol/L (ref 22–32)
CREATININE: 1.06 mg/dL — AB (ref 0.44–1.00)
Calcium: 8.5 mg/dL — ABNORMAL LOW (ref 8.9–10.3)
Chloride: 100 mmol/L — ABNORMAL LOW (ref 101–111)
GFR calc Af Amer: >60 mL/min (ref >60)
GFR calc non Af Amer: >60 mL/min (ref >60)
Glucose, Bld: 93 mg/dL (ref 65–99)
Potassium: 3.8 mmol/L (ref 3.5–5.1)
SODIUM: 134 mmol/L — AB (ref 135–145)

## 2017-05-05 LAB — CBC WITH DIFFERENTIAL/PLATELET
Basophils Absolute: 0 10*3/uL (ref 0–0.1)
Basophils Relative: 1 %
EOS PCT: 1 %
Eosinophils Absolute: 0.1 10*3/uL (ref 0–0.7)
HCT: 39.5 % (ref 35.0–47.0)
Hemoglobin: 13.3 g/dL (ref 12.0–16.0)
LYMPHS ABS: 1.8 10*3/uL (ref 1.0–3.6)
Lymphocytes Relative: 21 %
MCH: 30.3 pg (ref 26.0–34.0)
MCHC: 33.7 g/dL (ref 32.0–36.0)
MCV: 89.7 fL (ref 80.0–100.0)
MONO ABS: 0.7 10*3/uL (ref 0.2–0.9)
Monocytes Relative: 8 %
Neutro Abs: 6.3 10*3/uL (ref 1.4–6.5)
Neutrophils Relative %: 69 %
PLATELETS: 227 10*3/uL (ref 150–440)
RBC: 4.4 MIL/uL (ref 3.80–5.20)
RDW: 13.5 % (ref 11.5–14.5)
WBC: 8.9 10*3/uL (ref 3.6–11.0)

## 2017-05-05 LAB — MAGNESIUM: Magnesium: 2.1 mg/dL (ref 1.7–2.4)

## 2017-05-05 MED ORDER — SODIUM CHLORIDE 0.9 % IV SOLN
INTRAVENOUS | Status: DC | PRN
  Administered 2017-05-05: 14:00:00 via INTRAVENOUS

## 2017-05-05 NOTE — Progress Notes (Addendum)
ADDENDUM: Patient reports her LLQ abdominal pain continues to improve, and she has been tolerating clear liquids diet and ambulating with +flatus, though expresses anxiety about having a BM. Ambulation encouraged, discussed importance of not holding BM's and contribution of constipation to diverticular disease. Will advance to full liquids diet.  -- Scherrie Gerlach Earlene Plater, MD, RPVI [place]: [facility] General Surgery - Partnering for exceptional care. Office: [phone]      SURGICAL PROGRESS NOTE (cpt [phone])  Hospital Day(s): 1.   Post op day(s):  Marland Kitchen   Interval History: Patient seen and examined, no acute events or new complaints overnight. Patient reports her pain has improved with +flatus and +BM, remaining moderate only now at and around her LLQ. She otherwise denies N/V, fever/chills, CP, or SOB. She also stated she intends to walk today.  Review of Systems:  Constitutional: denies fever, chills  HEENT: denies cough or congestion  Respiratory: denies any shortness of breath  Cardiovascular: denies chest pain or palpitations  Gastrointestinal: abdominal pain, N/V, and bowel function as per interval history Genitourinary: denies burning with urination or urinary frequency Musculoskeletal: denies pain, decreased motor or sensation Integumentary: denies any other rashes or skin discolorations Neurological: denies HA or vision/hearing changes   Vital signs in last 24 hours: [min-max] current  Temp:  [97.5 F (36.4 C)-98.6 F (37 C)] 97.9 F (36.6 C) (02/13 0533) Pulse Rate:  [85-124] 85 (02/13 0533) Resp:  [16-19] 19 (02/13 0533) BP: (115-141)/(76-97) 115/76 (02/13 0533) SpO2:  [95 %-99 %] 97 % (02/13 0533) Weight:  [285 lb (129.3 kg)] 285 lb (129.3 kg) (02/12 1312)     Height: 5\' 3"  (160 cm) Weight: 285 lb (129.3 kg) BMI (Calculated): 50.5   Intake/Output this shift:  No intake/output data recorded.   Intake/Output last 2 shifts:  @IOLAST2SHIFTS @    Physical Exam:  Constitutional: alert, cooperative and no distress  HENT: normocephalic without obvious abnormality  Eyes: PERRL, EOM's grossly intact and symmetric  Neuro: CN II - XII grossly intact and symmetric without deficit  Respiratory: breathing non-labored at rest  Cardiovascular: regular rate and sinus rhythm  Gastrointestinal: soft, obese, and non-distended with moderate LLQ and suprapubic abdominal tenderness to palpation Musculoskeletal: UE and LE FROM, no wounds appreciated, motor and sensation grossly intact, NT   Labs:  CBC Latest Ref Rng & Units 05/05/2017 05/04/2017 04/24/2017  WBC 3.6 - 11.0 K/uL 8.9 15.0(H) 6.8  Hemoglobin 12.0 - 16.0 g/dL 91.4 78.2 95.6  Hematocrit 35.0 - 47.0 % 39.5 47.1(H) 44.5  Platelets 150 - 440 K/uL 227 312 272   CMP Latest Ref Rng & Units 05/05/2017 05/04/2017 04/24/2017  Glucose 65 - 99 mg/dL 93 213(Y) 865(H)  BUN 6 - 20 mg/dL 14 11 16   Creatinine 0.44 - 1.00 mg/dL 8.46(N) 6.29(B) 2.84  Sodium 135 - 145 mmol/L 134(L) 134(L) 136  Potassium 3.5 - 5.1 mmol/L 3.8 3.5 3.9  Chloride 101 - 111 mmol/L 100(L) 96(L) 104  CO2 22 - 32 mmol/L 25 27 23   Calcium 8.9 - 10.3 mg/dL 1.3(K) 9.9 9.0  Total Protein 6.5 - 8.1 g/dL - 8.9(H) -  Total Bilirubin 0.3 - 1.2 mg/dL - 1.0 -  Alkaline Phos 38 - 126 U/L - 56 -  AST 15 - 41 U/L - 21 -  ALT 14 - 54 U/L - 15 -   Imaging studies: No new pertinent imaging studies, though admission CT personally reviewed and discussed with patient   Assessment/Plan: (ICD-10's: 29.32) 37 y.o. female with first  episode of acute sigmoid colonic diverticulitis without appreciable abscess and normalized leukocytosis (15.0 -> 8.9) today, complicated by pertinent comorbidities including morbid obesity (BMI 51), HTN, and a history of uterine ablation.   - continue IV antibiotics  - pain control prn (minimize narcotics)  - will order to start clear liquids diet for now  - continue to monitor abdominal exam and bowel function  -  medical management of comorbidities (home medications)  - DVT prophylaxis, ambulation encouraged  All of the above findings and recommendations were discussed with the patient and patient's RN, and all of patient's questions were answered to her expressed satisfaction.  -- Scherrie GerlachJason E. Earlene Plateravis, MD, RPVI [place]: [facility] General [facility] - Partnering for exceptional care. Office: [phone]

## 2017-05-06 MED ORDER — AMOXICILLIN-POT CLAVULANATE 875-125 MG PO TABS
1.0000 | ORAL_TABLET | Freq: Two times a day (BID) | ORAL | Status: DC
  Administered 2017-05-06 – 2017-05-07 (×3): 1 via ORAL
  Filled 2017-05-06 (×3): qty 1

## 2017-05-06 NOTE — Progress Notes (Signed)
Afternoon rounds.  Patient doing very well.  Discussed with patient that if she continues to do this well she will likely discharge home in the morning.  Meredith Frameharles Deyonna Fitzsimmons, MD [facility] General Surgeon [facility]  Day ASCOM [phone](7a-7p) [phone] Night ASCOM [phone](7p-7a) [phone]

## 2017-05-06 NOTE — Progress Notes (Signed)
CC: Diverticulitis Subjective: Patient reports continuing to feel better than the day before.  Minimal tenderness to the left lower quadrant.  Tolerating a diet.  Objective: Vital signs in last 24 hours: Temp:  [97.3 F (36.3 C)-98.5 F (36.9 C)] 97.3 F (36.3 C) (02/14 0506) Pulse Rate:  [80-85] 85 (02/14 0506) Resp:  [18] 18 (02/14 0506) BP: (104-138)/(70-82) 138/70 (02/14 0506) SpO2:  [95 %-100 %] 98 % (02/14 0506) Last BM Date: 05/02/17  Intake/Output from previous day: 02/13 0701 - 02/14 0700 In: 3677 [P.O.:720; I.V.:2768; IV Piggyback:189] Out: 850 [Urine:850] Intake/Output this shift: No intake/output data recorded.  Physical exam:  General: No acute distress Chest: Clear to auscultation Heart: Regular rate and rhythm Abdomen: Large, soft, minimally tender to deep palpation in the left lower quadrant.  Lab Results: CBC  Recent Labs    05/04/17 1309 05/05/17 0453  WBC 15.0* 8.9  HGB 15.5 13.3  HCT 47.1* 39.5  PLT 312 227   BMET Recent Labs    05/04/17 1309 05/05/17 0453  NA 134* 134*  K 3.5 3.8  CL 96* 100*  CO2 27 25  GLUCOSE 131* 93  BUN 11 14  CREATININE 1.33* 1.06*  CALCIUM 9.9 8.5*   PT/INR No results for input(s): LABPROT, INR in the last 72 hours. ABG No results for input(s): PHART, HCO3 in the last 72 hours.  Invalid input(s): PCO2, PO2  Studies/Results: Ct Abdomen Pelvis W Contrast  Result Date: 05/04/2017 CLINICAL DATA:  Dizziness, lower abdominal pain, and nausea. Pallor. EXAM: CT ABDOMEN AND PELVIS WITH CONTRAST TECHNIQUE: Multidetector CT imaging of the abdomen and pelvis was performed using the standard protocol following bolus administration of intravenous contrast. CONTRAST:  ISOVUE-300 IOPAMIDOL (ISOVUE-300) INJECTION 61% COMPARISON:  None. FINDINGS: Lower chest: Unremarkable Hepatobiliary: Unremarkable Pancreas: Unremarkable Spleen: Unremarkable Adrenals/Urinary Tract: Unremarkable Stomach/Bowel: Acute diverticulitis  involving the proximal sigmoid colon in the lower anterior midline of the pelvis on image 70/2, with a 1.8 by 0.6 by 0.7 cm (volume = 0.4 cm^3) collection of gas along the anterior inferior margin of the bowel which could conceivably be within a large inflamed diverticulum but which may be contained extraluminal gas. There is no free intraperitoneal gas observed. Considerable surrounding inflammatory stranding. Appendix normal. Vascular/Lymphatic: Unremarkable Reproductive: Essure type devices are present bilaterally. There may also be a separate IUD. Other: No supplemental non-categorized findings. Musculoskeletal: Mild disc bulges at L4-5 and L5-S1. IMPRESSION: 1. Acute proximal sigmoid diverticulitis with extensive surrounding inflammatory stranding. There is a small (0.4 cubic cm) collection of gas along the anterior inferior margin of the involved segment which could be within an inflamed diverticulum but could also be a small contained extraluminal collection of gas. There is no overt free intraperitoneal gas. Electronically Signed   By: Gaylyn Rong M.D.   On: 05/04/2017 17:08    Anti-infectives: Anti-infectives (From admission, onward)   Start     Dose/Rate Route Frequency Ordered Stop   05/04/17 1830  piperacillin-tazobactam (ZOSYN) IVPB 3.375 g     3.375 g 12.5 mL/hr over 240 Minutes Intravenous Every 8 hours 05/04/17 1827     05/04/17 1745  piperacillin-tazobactam (ZOSYN) IVPB 3.375 g     3.375 g 100 mL/hr over 30 Minutes Intravenous  Once 05/04/17 1734 05/04/17 1828      Assessment/Plan:  37 year old female admitted with diverticulitis.  Much improved from admission.  Plan to transition to oral antibiotics today.  Discussed with nursing staff that her current bag of IV fluids should be her  last bag of IV fluids.  Encourage ambulation, incentive spirometer usage, oral intake.  Anticipate being ready for discharge tomorrow.  Clifton Safley T. Tonita CongWoodham, MD, [facility] General Surgeon [facility]  Day ASCOM [phone](7a-7p) [phone] Night ASCOM [phone](7p-7a) [phone] 05/06/2017

## 2017-05-07 DIAGNOSIS — R109 Unspecified abdominal pain: Secondary | ICD-10-CM

## 2017-05-07 MED ORDER — AMOXICILLIN-POT CLAVULANATE 875-125 MG PO TABS: 1.0000 | ORAL_TABLET | Freq: Two times a day (BID) | ORAL | 0 refills | Status: DC

## 2017-05-07 NOTE — Discharge Instructions (Signed)

## 2017-05-07 NOTE — Progress Notes (Signed)
IV was removed. Discharge instructions, follow-up appointments, and prescriptions were provided to the pt. All questions answered. The pt was taken downstairs via wheelchair by NT.  

## 2017-05-07 NOTE — Care Management (Signed)
Patient discharged today.  Patient provided with application to [facility], Medication Management , and "The Network:  Your Guide to [employer] [facility] [employer] [facility] [place] [place]"  Booklet. Patient provided with coupons from goodrx.com for her antibiotics.  Denies issues obtaining medication. RNCM signing off.

## 2017-05-07 NOTE — Discharge Summary (Signed)
Patient ID: Meredith Myers MRN: [id number] DOB/AGE: 04-02-1980 37 y.o.  Admit date: 05/04/2017 Discharge date: 05/07/2017  Discharge Diagnoses:  Diverticulitis  Procedures Performed: None  Discharged Condition: good  Hospital Course: Patient admitted with diverticulitis. Responded to antibiotics. On day of discharge she was tolerating a diet, having bowel function and her abdomen was benign.   Discharge Orders: Discharge Instructions    Call MD for:  persistant nausea and vomiting   Complete by:  As directed    Call MD for:  redness, tenderness, or signs of infection (pain, swelling, redness, odor or green/yellow discharge around incision site)   Complete by:  As directed    Call MD for:  severe uncontrolled pain   Complete by:  As directed    Call MD for:  temperature >100.4   Complete by:  As directed    Diet - low sodium heart healthy   Complete by:  As directed    Increase activity slowly   Complete by:  As directed       Disposition: 01-Home or Self Care  Discharge Medications: Allergies as of 05/07/2017   No Known Allergies     Medication List    TAKE these medications   amLODipine 5 MG tablet Commonly known as:  NORVASC Take 1 tablet (5 mg total) by mouth daily.   amoxicillin-clavulanate 875-125 MG tablet Commonly known as:  AUGMENTIN Take 1 tablet by mouth every 12 (twelve) hours.   hydrochlorothiazide 25 MG tablet Commonly known as:  HYDRODIURIL Take 1 tablet (25 mg total) by mouth daily.        Follwup: Follow-up Information    [facility] [place]. Schedule an appointment as soon as possible for a visit in 10 day(s).   Specialty:  General Surgery Why:  f/u diverticulitis. Dr. Earlene Plateravis if available. Contact information: [address] [place] [phone]          Signed: Ricarda FrameCharles Latravious Levitt 05/07/2017, 8:27 AM

## 2017-05-10 ENCOUNTER — Telehealth: Payer: Self-pay | Admitting: General Practice

## 2017-05-10 NOTE — Telephone Encounter (Signed)
Spoke with the patient over the phone payment was made for fmla paperwork and paperwork has been placed in folder up front. Patient is asking for us to fax and the patient would also like the original copy back for herself after paperwork has been filled out and faxed.

## 2017-05-13 NOTE — Telephone Encounter (Signed)
fmla paperwork has been faxed and copy for patient is upfront.

## 2017-05-14 ENCOUNTER — Telehealth: Payer: Self-pay | Admitting: General Practice

## 2017-05-14 NOTE — Telephone Encounter (Signed)
Patient is calling about her fmla paperwork, some parts in the paperwork need updated, also needs a letter for her work. Please call patient and advise.

## 2017-05-14 NOTE — Telephone Encounter (Signed)
Patient came by the office and picked up her paperwork.

## 2017-05-17 ENCOUNTER — Ambulatory Visit (INDEPENDENT_AMBULATORY_CARE_PROVIDER_SITE_OTHER): Payer: Self-pay | Admitting: Surgery

## 2017-05-17 ENCOUNTER — Encounter: Payer: Self-pay | Admitting: Surgery

## 2017-05-17 VITALS — BP 171/113 | HR 101 | Temp 98.0°F | Ht 63.0 in | Wt 301.0 lb

## 2017-05-17 DIAGNOSIS — K5720 Diverticulitis of large intestine with perforation and abscess without bleeding: Secondary | ICD-10-CM

## 2017-05-17 MED ORDER — AMOXICILLIN-POT CLAVULANATE 875-125 MG PO TABS: 1.0000 | ORAL_TABLET | Freq: Two times a day (BID) | ORAL | 0 refills | Status: AC

## 2017-05-17 NOTE — Patient Instructions (Signed)
We have seen you today in regards to your diverticulitis episodes.  Please avoid nuts, seeds, corn, and popcorn as much as possible. If you do indeed have to eat these things every so often, make sure that you chew these up as much as possible. I have also included some further diet information for you to review. Increase your water intake to approximately 72 oz. Daily. (This is approximately 9- 8oz. Glasses) to help avoid constipation.  Increase your fiber intake as much as possible (see diet below) and you may also use a stool softener (Colace / Docusate Sodium) to help soften stool as well as a mild laxative (Miralax / Polyethylene Glycol) to help you pass your stool. Your goal with bowel movements are 1-2 soft formed bowel movements daily.  If you begin having the same abdominal pain, nausea, vomiting, or fever - Please call our office immediately so that we may send you in some antibiotics to your pharmacy and we will have you follow-up the following day with our surgeon in the office.   Diverticulitis Diverticulitis is inflammation or infection of small pouches in your colon that form when you have a condition called diverticulosis. The pouches in your colon are called diverticula. Your colon, or large intestine, is where water is absorbed and stool is formed. Complications of diverticulitis can include:  Bleeding.  Severe infection.  Severe pain.  Perforation of your colon.  Obstruction of your colon. CAUSES  Diverticulitis is caused by bacteria. Diverticulitis happens when stool becomes trapped in diverticula. This allows bacteria to grow in the diverticula, which can lead to inflammation and infection. RISK FACTORS People with diverticulosis are at risk for diverticulitis. Eating a diet that does not include enough fiber from fruits and vegetables may make diverticulitis more likely to develop. SYMPTOMS  Symptoms of diverticulitis may include:  Abdominal pain and tenderness.  The pain is normally located on the left side of the abdomen, but may occur in other areas.  Fever and chills.  Bloating.  Cramping.  Nausea.  Vomiting.  Constipation.  Diarrhea.  Blood in your stool. DIAGNOSIS  Your health care provider will ask you about your medical history and do a physical exam. You may need to have tests done because many medical conditions can cause the same symptoms as diverticulitis. Tests may include:  Blood tests.  Urine tests.  Imaging tests of the abdomen, including X-rays and CT scans. When your condition is under control, your health care provider may recommend that you have a colonoscopy. A colonoscopy can show how severe your diverticula are and whether something else is causing your symptoms. TREATMENT  Most cases of diverticulitis are mild and can be treated at home. Treatment may include:  Taking over-the-counter pain medicines.  Following a clear liquid diet.  Taking antibiotic medicines by mouth for 7-10 days. More severe cases may be treated at a hospital. Treatment may include:  Not eating or drinking.  Taking prescription pain medicine.  Receiving antibiotic medicines through an IV tube.  Receiving fluids and nutrition through an IV tube.  Surgery. HOME CARE INSTRUCTIONS   Follow your health care provider's instructions carefully.  Follow a full liquid diet or other diet as directed by your health care provider. After your symptoms improve, your health care provider may tell you to change your diet. He or she may recommend you eat a high-fiber diet. Fruits and vegetables are good sources of fiber. Fiber makes it easier to pass stool.  Take fiber supplements or   probiotics as directed by your health care provider.  Only take medicines as directed by your health care provider.  Keep all your follow-up appointments. SEEK MEDICAL CARE IF:   Your pain does not improve.  You have a hard time eating food.  Your bowel  movements do not return to normal. SEEK IMMEDIATE MEDICAL CARE IF:   Your pain becomes worse.  Your symptoms do not get better.  Your symptoms suddenly get worse.  You have a fever.  You have repeated vomiting.  You have bloody or black, tarry stools. MAKE SURE YOU:   Understand these instructions.  Will watch your condition.  Will get help right away if you are not doing well or get worse.   This information is not intended to replace advice given to you by your health care provider. Make sure you discuss any questions you have with your health care provider.   Document Released: 12/17/2004 Document Revised: 03/14/2013 Document Reviewed: 02/01/2013 Elsevier Interactive Patient Education 2016 Elsevier Inc.   High-Fiber Diet Fiber, also called dietary fiber, is a type of carbohydrate found in fruits, vegetables, whole grains, and beans. A high-fiber diet can have many health benefits. Your health care provider may recommend a high-fiber diet to help:  Prevent constipation. Fiber can make your bowel movements more regular.  Lower your cholesterol.  Relieve hemorrhoids, uncomplicated diverticulosis, or irritable bowel syndrome.  Prevent overeating as part of a weight-loss plan.  Prevent heart disease, type 2 diabetes, and certain cancers. WHAT IS MY PLAN? The recommended daily intake of fiber includes:  38 grams for men under age 50.  30 grams for men over age 50.  25 grams for women under age 50.  21 grams for women over age 50. You can get the recommended daily intake of dietary fiber by eating a variety of fruits, vegetables, grains, and beans. Your health care provider may also recommend a fiber supplement if it is not possible to get enough fiber through your diet. WHAT DO I NEED TO KNOW ABOUT A HIGH-FIBER DIET?  Fiber supplements have not been widely studied for their effectiveness, so it is better to get fiber through food sources.  Always check the fiber  content on thenutrition facts label of any prepackaged food. Look for foods that contain at least 5 grams of fiber per serving.  Ask your dietitian if you have questions about specific foods that are related to your condition, especially if those foods are not listed in the following section.  Increase your daily fiber consumption gradually. Increasing your intake of dietary fiber too quickly may cause bloating, cramping, or gas.  Drink plenty of water. Water helps you to digest fiber. WHAT FOODS CAN I EAT? Grains Whole-grain breads. Multigrain cereal. Oats and oatmeal. Brown rice. Barley. Bulgur wheat. Millet. Bran muffins. Popcorn. Rye wafer crackers. Vegetables Sweet potatoes. Spinach. Kale. Artichokes. Cabbage. Broccoli. Green peas. Carrots. Squash. Fruits Berries. Pears. Apples. Oranges. Avocados. Prunes and raisins. Dried figs. Meats and Other Protein Sources Navy, kidney, pinto, and soy beans. Split peas. Lentils. Nuts and seeds. Dairy Fiber-fortified yogurt. Beverages Fiber-fortified soy milk. Fiber-fortified orange juice. Other Fiber bars. The items listed above may not be a complete list of recommended foods or beverages. Contact your dietitian for more options. WHAT FOODS ARE NOT RECOMMENDED? Grains White bread. Pasta made with refined flour. White rice. Vegetables Fried potatoes. Canned vegetables. Well-cooked vegetables.  Fruits Fruit juice. Cooked, strained fruit. Meats and Other Protein Sources Fatty cuts of meat. Fried poultry   or fried fish. Dairy Milk. Yogurt. Cream cheese. Sour cream. Beverages Soft drinks. Other Cakes and pastries. Butter and oils. The items listed above may not be a complete list of foods and beverages to avoid. Contact your dietitian for more information. WHAT ARE SOME TIPS FOR INCLUDING HIGH-FIBER FOODS IN MY DIET?  Eat a wide variety of high-fiber foods.  Make sure that half of all grains consumed each day are whole  grains.  Replace breads and cereals made from refined flour or white flour with whole-grain breads and cereals.  Replace white rice with brown rice, bulgur wheat, or millet.  Start the day with a breakfast that is high in fiber, such as a cereal that contains at least 5 grams of fiber per serving.  Use beans in place of meat in soups, salads, or pasta.  Eat high-fiber snacks, such as berries, and raw vegetables.   This information is not intended to replace advice given to you by your health care provider. Make sure you discuss any questions you have with your health care provider.   Document Released: 03/09/2005 Document Revised: 03/30/2014 Document Reviewed: 08/22/2013 Elsevier Interactive Patient Education 2016 Elsevier Inc.  

## 2017-05-17 NOTE — Telephone Encounter (Signed)
Call made to patient at this time. She verbalized that before she was discharged by Dr. Tonita CongWoodham that he verbalized to her for her to remain out of work until she finished her antibiotics. I verbalized to her that I did not see in his note that she was to stay out of work that long and that based off the information/notes that I currently have that I can only cover her FMLA paperwork for 2/12-2/15. She verbalized some concern and I advised her that I would send Dr. Tonita CongWoodham a message and inform her of his response on 2/25 when she comes in for her appointment. She verbalized understanding.   Patient came in today for her appointment and I advised that I did receive a message from Dr. Tonita CongWoodham agreeing to the conversation of her staying out of work while on antibiotics. I verbalized to her that I have faxed over a revised copy of the FMLA as well as a letter. A copy of all faxed documentation was given to patient at this time. Patient was grateful and verbalized understand.

## 2017-05-17 NOTE — Progress Notes (Signed)
Surgical Clinic Progress/Follow-up Note   HPI:  37 y.o. very pleasant obese Female presents to clinic for follow-up evaluation s/p inpatient admission for acute sigmoid colonic diverticulitis. Patient reports her suprapubic > LLQ abdominal pain has continued to improve, though remains somewhat, causing her some anxiety regarding BM's, which she describes as overall normal and daily except 2 loose BM's and occasional/rare constipation. She otherwise denies N/V or fever/chills. Patient says she's been checking her BP using a cuff at home with BP's ranging from 130's - 140's. She denies frequent headaches, CP, or SOB. Lastly, patient says she has previously attempted to lose weight by various diets and exercise, but she has not been successful. She reports she's spoken with friends and family who've underwent bariatric surgery, but she's been scared to consider or pursue further.  Review of Systems:  Constitutional: denies any other weight loss, fever, chills, or sweats  Eyes: denies any other vision changes, history of eye injury  ENT: denies sore throat, hearing problems  Respiratory: denies shortness of breath, wheezing  Cardiovascular: denies chest pain, palpitations  Gastrointestinal: abdominal pain, N/V, and bowel function as per HPI Musculoskeletal: denies any other joint pains or cramps  Skin: Denies any other rashes or skin discolorations  Neurological: denies any other headache, dizziness, weakness  Psychiatric: denies any other depression, anxiety  All other review of systems: otherwise negative   Vital Signs:  BP (!) 171/113   Pulse (!) 101   Temp 98 F (36.7 C) (Oral)   Ht 5\' 3"  (1.6 m)   Wt (!) 301 lb (136.5 kg)   BMI 53.32 kg/m    Physical Exam:  Constitutional:  -- Obese body habitus  -- Awake, alert, and oriented x3  Eyes:  -- Pupils equally round and reactive to light  -- No scleral icterus  Ear, nose, throat:  -- No jugular venous distension  -- No nasal  drainage, bleeding Pulmonary:  -- No crackles -- Equal breath sounds bilaterally -- Breathing non-labored at rest Cardiovascular:  -- S1, S2 present  -- No pericardial rubs  Gastrointestinal:  -- Soft and obese, but non-distended with mild-/moderate- suprapubic > LLQ abdominal tenderness to palpation, no guarding/rebound  -- No abdominal masses appreciated, pulsatile or otherwise  Musculoskeletal / Integumentary:  -- Wounds or skin discoloration: None appreciated  -- Extremities: B/L UE and LE FROM, hands and feet warm Neurologic:  -- Motor function: intact and symmetric  -- Sensation: intact and symmetric   Assessment:  37 y.o. yo Female with a problem list including...  Patient Active Problem List   Diagnosis Date Noted  . Abdominal pain   . Diverticulitis 05/04/2017    presents to clinic for follow-up evaluation of acute sigmoid colonic diverticulitis with possible sigmoid colonic phlegmon vs contained extraluminal air/fluid without obvious abscess appreciated, complicated by comorbidities including morbid obesity (BMI >53) and HTN.  Plan:   - antibiotics extended x 7 days due to residual pain and possible phlegmon vs contained extraluminal fluid/air on her pre-admission CT  - discussed importance of hydration with easy-to-digest low-fiber foods x 6 weeks, followed by ongoing high fiber diet to reduce constipation and accordingly her risk for further diverticular disease  - also discussed and encouraged long-term substantial weight loss considering her morbid obesity and young age, provided bariatric program/surgery information as requested by patient  - advised patient to resume anti-HTN medication prescribed by ED and schedule appointment with primary care physician (phone number/card provided for patient to locate and schedule an appointment with  a PMD)  - return to clinic in 1 week to reassess slowly improving suprapubic/LLQ abdominal pain  - instructed to call office if any  questions or concerns  All of the above recommendations were discussed with the patient, and all of patient's questions were answered to her expressed satisfaction.  -- Scherrie Gerlach Earlene Plater, MD, RPVI [place]: [facility] General [facility] - Partnering for exceptional care. Office: [phone]

## 2017-05-24 ENCOUNTER — Ambulatory Visit (INDEPENDENT_AMBULATORY_CARE_PROVIDER_SITE_OTHER): Payer: Self-pay | Admitting: General Surgery

## 2017-05-24 ENCOUNTER — Encounter: Payer: Self-pay | Admitting: General Surgery

## 2017-05-24 VITALS — BP 134/89 | HR 98 | Resp 13 | Ht 63.0 in | Wt 289.8 lb

## 2017-05-24 DIAGNOSIS — K5792 Diverticulitis of intestine, part unspecified, without perforation or abscess without bleeding: Secondary | ICD-10-CM

## 2017-05-24 MED ORDER — AMOXICILLIN-POT CLAVULANATE 875-125 MG PO TABS: 1.0000 | ORAL_TABLET | Freq: Two times a day (BID) | ORAL | 0 refills | Status: DC

## 2017-05-24 NOTE — Progress Notes (Signed)
Outpatient Surgical Follow Up  05/24/2017  Rayvon CharDeanna Zuidema is an 37 y.o. female.   Chief Complaint  Patient presents with  . Follow-up    hospital follow up diverticulitis    HPI: 37 year old female returns to clinic for follow-up for diverticulitis.  Patient reports feeling much better.  Her last stated bout of pain was 2 days ago.  Otherwise she states she is eating well denies any fevers, chills, nausea, vomiting, chest pain, shortness of breath, diarrhea, constipation.  She is still thinking about bariatric surgery and confirmed again today that she is never had a colonoscopy.  Past Medical History:  Diagnosis Date  . Diverticulitis 2019  . Hypertension     Past Surgical History:  Procedure Laterality Date  . ablation of uterus      History reviewed. No pertinent family history.  Social History:  reports that she has quit smoking. Her smoking use included cigarettes. she has never used smokeless tobacco. She reports that she does not drink alcohol or use drugs.  Allergies: No Known Allergies  Medications reviewed.    ROS A multipoint review of systems was completed, all pertinent positives and negatives are documented within the HPI and the remainder are negative   BP 134/89   Pulse 98   Resp 13   Ht 5\' 3"  (1.6 m)   Wt 131.5 kg (289 lb 12.8 oz)   SpO2 97%   BMI 51.34 kg/m   Physical Exam General: No acute distress Chest: Clear to auscultation Heart: Regular rate and rhythm Abdomen: Large, soft, nondistended, minimally tender to deep palpation in the left lower quadrant.    No results found for this or any previous visit (from the past 48 hour(s)). No results found.  Assessment/Plan:  1. Diverticulitis 37 year old female with a recent hospitalization for diverticulitis.  Continues to be a little tender on deep palpation.  Discussed the importance of being completely pain-free and without tenderness before stopping antibiotics.  Discussed she needs to be  pain-free for at least 7 days before she stops.  Plan to call in an additional 7 days of antibiotics and have her follow-up in clinic in 1 week for repeat exam.  Patient voiced understanding of the plan.  She knows to call should the pain worsen while antibiotics.  She anticipates a colonoscopy 6 weeks after she is had complete resolution of symptoms.     Ricarda Frameharles Shadow Stiggers, MD FACS General Surgeon  05/24/2017,9:34 AM

## 2017-05-24 NOTE — Patient Instructions (Addendum)
Follow up here on one week Continue your antibiotics for one more week. We have sent these into your pharmacy. You will need a Colonoscopy 6 weeks after you finish your antibiotics. We will talk about this at your next follow up.   Let us know if you have any pain in the meantime.

## 2017-05-31 ENCOUNTER — Ambulatory Visit (INDEPENDENT_AMBULATORY_CARE_PROVIDER_SITE_OTHER): Payer: Self-pay | Admitting: Surgery

## 2017-05-31 ENCOUNTER — Encounter: Payer: Self-pay | Admitting: Surgery

## 2017-05-31 ENCOUNTER — Ambulatory Visit
Admission: RE | Admit: 2017-05-31 | Discharge: 2017-05-31 | Disposition: A | Discharge status: Home / Self Care | Payer: Self-pay | Source: Ambulatory Visit | Attending: Surgery | Admitting: Surgery

## 2017-05-31 DIAGNOSIS — K5732 Diverticulitis of large intestine without perforation or abscess without bleeding: Secondary | ICD-10-CM | POA: Insufficient documentation

## 2017-05-31 DIAGNOSIS — K760 Fatty (change of) liver, not elsewhere classified: Secondary | ICD-10-CM | POA: Insufficient documentation

## 2017-05-31 MED ORDER — IOPAMIDOL (ISOVUE-300) INJECTION 61%
125.0000 mL | Freq: Once | INTRAVENOUS | Status: AC | PRN
  Administered 2017-05-31: 125 mL via INTRAVENOUS

## 2017-05-31 MED ORDER — AMOXICILLIN-POT CLAVULANATE 875-125 MG PO TABS: 1.0000 | ORAL_TABLET | Freq: Two times a day (BID) | ORAL | 0 refills | Status: DC

## 2017-05-31 NOTE — Progress Notes (Signed)
Outpatient Surgical Follow Up  05/31/2017  Meredith Myers is an 37 y.o. female.   HY:[id number]:diverticulitis  HPI: This a patient with a history of diverticulitis.  She was recently hospitalized for this.  1 day left of antibiotics and is having considerable abdominal pain.  She states it is better than it had been the last week but is still present and points to the left lower quadrant.  She denies fevers or chills is not eating well and is constipated.  Past Medical History:  Diagnosis Date  . Diverticulitis 2019  . Hypertension     Past Surgical History:  Procedure Laterality Date  . ablation of uterus      No family history on file.  Social History:  reports that she has quit smoking. Her smoking use included cigarettes. she has never used smokeless tobacco. She reports that she does not drink alcohol or use drugs.  Allergies: No Known Allergies  Medications reviewed.   Review of Systems:   Review of Systems  Constitutional: Negative.   HENT: Negative.   Eyes: Negative.   Respiratory: Negative.   Cardiovascular: Negative.   Gastrointestinal: Positive for abdominal pain and constipation. Negative for blood in stool, diarrhea, heartburn, nausea and vomiting.  Genitourinary: Negative.   Musculoskeletal: Negative.   Skin: Negative.   Neurological: Negative.   Endo/Heme/Allergies: Negative.   Psychiatric/Behavioral: Negative.      Physical Exam:  There were no vitals taken for this visit.  Physical Exam  Constitutional: She is oriented to person, place, and time and well-developed, well-nourished, and in no distress.  Morbidly obese no acute distress  HENT:  Head: Normocephalic and atraumatic.  Eyes: Pupils are equal, round, and reactive to light. Right eye exhibits no discharge. Left eye exhibits no discharge. No scleral icterus.  Pulmonary/Chest: Effort normal. No respiratory distress.  Abdominal: Soft. She exhibits no distension. There is tenderness. There is no  rebound and no guarding.  Tenderness maximal in left lower quadrant without peritoneal signs.  Some suprapubic tenderness  Neurological: She is alert and oriented to person, place, and time.  Skin: Skin is warm and dry. No rash noted. No erythema.  Vitals reviewed.     No results found for this or any previous visit (from the past 48 hour(s)). No results found.  Assessment/Plan:  This patient with diverticulitis.  She is a been on IV antibiotics and transition to oral antibiotics this is her second round of oral antibiotics and she is almost out.  I would refill those at this point and repeat a CT scan as she has not resolved her pain.  We will call her with those results.  She may require hospitalization if her microperforation is not completely resolved at this point.  Lattie Hawichard E Tranise Forrest, MD, FACS

## 2017-05-31 NOTE — Patient Instructions (Signed)
[facility] for [facility] CT Scan and blood work. We will call you once we have results. Your prescription for your antibiotic was sent to your pharmacy.

## 2017-06-02 ENCOUNTER — Telehealth: Payer: Self-pay

## 2017-06-02 NOTE — Telephone Encounter (Signed)
Called patient again and left her a voicemail.   Patient needs to have her labs drawn. I will inform Dr. Excell Seltzerooper.

## 2017-06-06 DIAGNOSIS — K5732 Diverticulitis of large intestine without perforation or abscess without bleeding: Secondary | ICD-10-CM

## 2017-06-08 NOTE — Telephone Encounter (Signed)
Called patient and asked her how she was doing. She stated that she was feeling so much better. I asked if she had started taking the antibiotics that were prescribed when she was here last. She stated that she did. Patient was informed to finish her antibiotics and if she had any abdominal pain, nausea, vomiting, diarrhea, or a fever, to please give us a call. Patient understood and had no questions for me.

## 2017-07-14 ENCOUNTER — Telehealth: Payer: Self-pay

## 2017-07-14 ENCOUNTER — Ambulatory Visit
Admission: RE | Admit: 2017-07-14 | Discharge: 2017-07-14 | Disposition: A | Discharge status: Home / Self Care | Payer: Medicaid Other | Source: Ambulatory Visit | Attending: Surgery | Admitting: Surgery

## 2017-07-14 ENCOUNTER — Other Ambulatory Visit
Admission: RE | Admit: 2017-07-14 | Discharge: 2017-07-14 | Disposition: A | Discharge status: Home / Self Care | Payer: Medicaid Other | Source: Ambulatory Visit | Attending: Surgery | Admitting: Surgery

## 2017-07-14 ENCOUNTER — Encounter: Payer: Self-pay | Admitting: Surgery

## 2017-07-14 ENCOUNTER — Ambulatory Visit (INDEPENDENT_AMBULATORY_CARE_PROVIDER_SITE_OTHER): Payer: Self-pay | Admitting: Surgery

## 2017-07-14 DIAGNOSIS — K5732 Diverticulitis of large intestine without perforation or abscess without bleeding: Secondary | ICD-10-CM

## 2017-07-14 DIAGNOSIS — K760 Fatty (change of) liver, not elsewhere classified: Secondary | ICD-10-CM | POA: Diagnosis not present

## 2017-07-14 DIAGNOSIS — R933 Abnormal findings on diagnostic imaging of other parts of digestive tract: Secondary | ICD-10-CM | POA: Diagnosis not present

## 2017-07-14 DIAGNOSIS — K5792 Diverticulitis of intestine, part unspecified, without perforation or abscess without bleeding: Secondary | ICD-10-CM

## 2017-07-14 LAB — CBC WITH DIFFERENTIAL/PLATELET
BASOS ABS: 0 10*3/uL (ref 0–0.1)
Basophils Relative: 0 %
EOS PCT: 1 %
Eosinophils Absolute: 0 10*3/uL (ref 0–0.7)
HEMATOCRIT: 36.2 % (ref 35.0–47.0)
HEMOGLOBIN: 12.7 g/dL (ref 12.0–16.0)
LYMPHS ABS: 1.7 10*3/uL (ref 1.0–3.6)
LYMPHS PCT: 20 %
MCH: 30.8 pg (ref 26.0–34.0)
MCHC: 35.1 g/dL (ref 32.0–36.0)
MCV: 87.9 fL (ref 80.0–100.0)
Monocytes Absolute: 0.5 10*3/uL (ref 0.2–0.9)
Monocytes Relative: 6 %
Neutro Abs: 6.4 10*3/uL (ref 1.4–6.5)
Neutrophils Relative %: 73 %
Platelets: 251 10*3/uL (ref 150–440)
RBC: 4.12 MIL/uL (ref 3.80–5.20)
RDW: 13.8 % (ref 11.5–14.5)
WBC: 8.7 10*3/uL (ref 3.6–11.0)

## 2017-07-14 MED ORDER — AMOXICILLIN-POT CLAVULANATE 875-125 MG PO TABS: 1.0000 | ORAL_TABLET | Freq: Two times a day (BID) | ORAL | 0 refills | Status: DC

## 2017-07-14 MED ORDER — IOPAMIDOL (ISOVUE-300) INJECTION 61%
125.0000 mL | Freq: Once | INTRAVENOUS | Status: AC | PRN
  Administered 2017-07-14: 125 mL via INTRAVENOUS

## 2017-07-14 NOTE — Progress Notes (Signed)
ADDENDUM: CBC and CT were personally reviewed and discussed with patient by phone. For recurrent versus seemingly more likely persistent sigmoid colonic diverticulitis with transmural abscess, will re-initiate antibiotics. Hospital admission was offered, but patient expresses preference for outpatient oral antibiotics at this time, which seems okay considering normal WBC and mild symptoms. Anticipated sigmoid colectomy was also discussed, as was the risk of colostomy and rational for beginning treatment with antibiotics to reduce patient's risk for needing a colostomy at the time of her surgery by controlling infection and inflammation accordingly. Will follow-up next week to reassess patient's symptoms. 2 - 3 weeks of oral antibiotics are anticipated. It doesn't seem likely patient will be able to undergo previously recommended colonoscopy prior to surgery. Patient advised to call if pain worsens or develops fever.        Surgical Clinic Progress/Follow-up Note   HPI:  37 y.o. Female presents to clinic for recurrent suprapubic > LLQ abdominal pain. Patient was [facility] months ago for first episode of similar suprapubic > LLQ abdominal pain and diagnosed with acute sigmoid colonic diverticulitis with intramural gas/collection. Following a course of IV antibiotics and bowel rest, patient was able to be discharged home with oral antibiotics, which were twice extended due to ongoing pain. CT was repeated and demonstrated decreased pericolonic fat stranding, but persistent intramural collection. CT results were called to patient, and she was advised to completed antibiotics and follow-up as needed.   Since then, she describes her pain resolved, and she's been eating a high fiber low-fat diet, has not experienced further constipation, and has lost nearly 35 lbs. However, she says she "didn't feel well" 4 days ago, but denies pain, fever, N/V at that time. On Sunday, she had a hard BM, for which she  took Miralax, which was followed by cramping abdominal pain and nausea without emesis. Yesterday, she began experiencing "sharp" suprapubic > LLQ abdominal pain, which she describes today as "uncomfortable" and non-specific intermittent "chills". Due to concern this may represent early recurrent/persistent diverticulitis, she called and presents accordingly. She has not yet scheduled previously advised colonoscopy, saying she was waiting 6 weeks instead of scheduling for 6 weeks after resolution.   Review of Systems:  Constitutional: denies any other weight loss, fever, chills, or sweats  Eyes: denies any other vision changes, history of eye injury  ENT: denies sore throat, hearing problems  Respiratory: denies shortness of breath, wheezing  Cardiovascular: denies chest pain, palpitations  Gastrointestinal: abdominal pain, N/V, and bowel function as per HPI Musculoskeletal: denies any other joint pains or cramps  Skin: Denies any other rashes or skin discolorations  Neurological: denies any other headache, dizziness, weakness  Psychiatric: denies any other depression, anxiety  All other review of systems: otherwise negative   Vital Signs:  BP (!) 165/102   Pulse (!) 53   Temp 98.1 F (36.7 C) (Oral)   Ht 5\' 3"  (1.6 m)   Wt 282 lb 12.8 oz (128.3 kg)   BMI 50.10 kg/m    Physical Exam:  Constitutional:  -- Morbidly obese body habitus  -- Awake, alert, and oriented x3  Eyes:  -- Pupils equally round and reactive to light  -- No scleral icterus  Ear, nose, throat:  -- No jugular venous distension  -- No nasal drainage, bleeding Pulmonary:  -- No crackles -- Equal breath sounds bilaterally -- Breathing non-labored at rest Cardiovascular:  -- S1, S2 present  -- No pericardial rubs  Gastrointestinal:  -- Soft, obese, and  non-distended with mild supra-pubic > LLQ abdominal tenderness to palpation, no guarding/rebound tenderness  -- No abdominal masses appreciated, pulsatile or  otherwise  Musculoskeletal / Integumentary:  -- Wounds or skin discoloration: None appreciated  -- Extremities: B/L UE and LE FROM, hands and feet warm, no edema  Neurologic:  -- Motor function: intact and symmetric  -- Sensation: intact and symmetric   Laboratory studies:  CBC Latest Ref Rng & Units 05/05/2017 05/04/2017 04/24/2017  WBC 3.6 - 11.0 K/uL 8.9 15.0(H) 6.8  Hemoglobin 12.0 - 16.0 g/dL 81.1 91.4 78.2  Hematocrit 35.0 - 47.0 % 39.5 47.1(H) 44.5  Platelets 150 - 440 K/uL 227 312 272   CMP Latest Ref Rng & Units 05/05/2017 05/04/2017 04/24/2017  Glucose 65 - 99 mg/dL 93 956(O) 130(Q)  BUN 6 - 20 mg/dL 14 11 16   Creatinine 0.44 - 1.00 mg/dL 6.57(Q) 4.69(G) 2.95  Sodium 135 - 145 mmol/L 134(L) 134(L) 136  Potassium 3.5 - 5.1 mmol/L 3.8 3.5 3.9  Chloride 101 - 111 mmol/L 100(L) 96(L) 104  CO2 22 - 32 mmol/L 25 27 23   Calcium 8.9 - 10.3 mg/dL 2.8(U) 9.9 9.0  Total Protein 6.5 - 8.1 g/dL - 8.9(H) -  Total Bilirubin 0.3 - 1.2 mg/dL - 1.0 -  Alkaline Phos 38 - 126 U/L - 56 -  AST 15 - 41 U/L - 21 -  ALT 14 - 54 U/L - 15 -    Imaging:  CT Abdomen and Pelvis with Contrast (05/31/2017) - personally reviewed and discussed with patient Abnormal wall thickening and low-density within the mid sigmoid colon. Low-density areas are concerning for intramural abscesses, the largest measuring 4.1 x 2.4 cm. Surrounding inflammation. Appearance has worsened since prior study. No evidence of bowel obstruction. No evidence of aneurysm or adenopathy.  Assessment:  37 y.o. yo Female with a problem list including...  Patient Active Problem List   Diagnosis Date Noted  . Diverticulitis of colon (without mention of hemorrhage)(562.11)   . Abdominal pain   . Diverticulitis 05/04/2017    presents to clinic for follow-up evaluation of prolonged/recurrent sigmoid colonic diverticulitis with recurrent suprapubic and LLQ abdominal pain, complicated by morbid obesity despite 35 lbs weight loss by  modifications of patient's diet and activity.  Plan:   - applauded patient for for her weight loss, encouraged her to continue her efforts  - considering patient's symptoms in context of prior CT findings, will check CBC and CT  - anticipate will need antibiotics and possibility of surgery also discussed  - instructed patient to call office if any questions or concerns  - follow-up to be determined pending above workup  All of the above recommendations were discussed with the patient, and all of patient's questions were answered to her expressed satisfaction.  -- Scherrie Gerlach Earlene Plater, MD, RPVI [place]: [facility] - Partnering for exceptional care. Office: [phone]

## 2017-07-14 NOTE — Telephone Encounter (Signed)
Severe abdominal cramping. Constipated. Nauseated. No appetite.  Last bowel movement 07/11/17. She states she feels the same as when she had a flare up with Diverticulitis.  She is added to 07/14/17 schedule with Dr.Davis @ 11:00 am.

## 2017-07-14 NOTE — Patient Instructions (Signed)
Please go over to the [facility] to have the CT scan done today. You will also need to stop by the lab today for blood work.  We will call you later today with the results of both test.

## 2017-07-14 NOTE — Addendum Note (Signed)
Addended by: Deloria LairAYLOR, Alante Weimann on: 07/14/2017 12:08 PM   Modules accepted: Orders

## 2017-07-14 NOTE — Telephone Encounter (Signed)
Patient notified of 07/22/17 appointment @ 10:15 am with Dr.Pabon and to pick up Antibiotic at drug store and start taking today.

## 2017-07-15 ENCOUNTER — Telehealth: Payer: Self-pay | Admitting: General Practice

## 2017-07-15 DIAGNOSIS — K5720 Diverticulitis of large intestine with perforation and abscess without bleeding: Principal | ICD-10-CM | POA: Diagnosis present

## 2017-07-15 DIAGNOSIS — Z79899 Other long term (current) drug therapy: Secondary | ICD-10-CM

## 2017-07-15 DIAGNOSIS — K5900 Constipation, unspecified: Secondary | ICD-10-CM | POA: Diagnosis present

## 2017-07-15 DIAGNOSIS — Z6841 Body Mass Index (BMI) 40.0 and over, adult: Secondary | ICD-10-CM

## 2017-07-15 DIAGNOSIS — I10 Essential (primary) hypertension: Secondary | ICD-10-CM | POA: Diagnosis present

## 2017-07-15 DIAGNOSIS — Z87891 Personal history of nicotine dependence: Secondary | ICD-10-CM

## 2017-07-15 LAB — COMPREHENSIVE METABOLIC PANEL
ALK PHOS: 51 U/L (ref 38–126)
ALT: 13 U/L — ABNORMAL LOW (ref 14–54)
ANION GAP: 9 (ref 5–15)
AST: 15 U/L (ref 15–41)
Albumin: 3.6 g/dL (ref 3.5–5.0)
BILIRUBIN TOTAL: 0.7 mg/dL (ref 0.3–1.2)
BUN: 9 mg/dL (ref 6–20)
CALCIUM: 8.9 mg/dL (ref 8.9–10.3)
CO2: 24 mmol/L (ref 22–32)
Chloride: 102 mmol/L (ref 101–111)
Creatinine, Ser: 0.96 mg/dL (ref 0.44–1.00)
GLUCOSE: 99 mg/dL (ref 65–99)
POTASSIUM: 3.8 mmol/L (ref 3.5–5.1)
Sodium: 135 mmol/L (ref 135–145)
TOTAL PROTEIN: 7.7 g/dL (ref 6.5–8.1)

## 2017-07-15 LAB — CBC
HEMATOCRIT: 39.3 % (ref 35.0–47.0)
HEMOGLOBIN: 13.3 g/dL (ref 12.0–16.0)
MCH: 29.8 pg (ref 26.0–34.0)
MCHC: 33.7 g/dL (ref 32.0–36.0)
MCV: 88.4 fL (ref 80.0–100.0)
Platelets: 279 10*3/uL (ref 150–440)
RBC: 4.44 MIL/uL (ref 3.80–5.20)
RDW: 13.9 % (ref 11.5–14.5)
WBC: 12 10*3/uL — AB (ref 3.6–11.0)

## 2017-07-15 LAB — LIPASE, BLOOD: Lipase: 19 U/L (ref 11–51)

## 2017-07-15 NOTE — Telephone Encounter (Signed)
Patients pain is worsening. She has no appetite. Its very uncomfortable to walk. She has been lying around all day due to not feeling well.  Denies fever, however she is having chills.  Patient was instructed to go to emergency room.  Spoke with Dr.Davis and he advised patient to be seen in Emergency room. Patient verbalized understanding.

## 2017-07-15 NOTE — Telephone Encounter (Signed)
Patients calling asking if she could get a doctors note for work. Patient said she is in a lot of pain, pain level being a five or six. [place] SpanielSaid it was very uncomfortable when she walks. Please call patient and advise.

## 2017-07-15 NOTE — ED Triage Notes (Signed)
Patient c/o lower abdominal pain, N/V. Patient denies diarrhea. Patient reports constipation, has had 1 small BM after taking milk of magnesia. Patient reports hx of diverticulitis.

## 2017-07-16 ENCOUNTER — Inpatient Hospital Stay
Admission: EM | Admit: 2017-07-16 | Discharge: 2017-07-18 | DRG: 392 | Disposition: A | Discharge status: Home / Self Care | Payer: Medicaid Other | Attending: Surgery | Admitting: Surgery

## 2017-07-16 ENCOUNTER — Emergency Department: Payer: Medicaid Other

## 2017-07-16 ENCOUNTER — Inpatient Hospital Stay: Payer: Medicaid Other

## 2017-07-16 ENCOUNTER — Other Ambulatory Visit: Payer: Self-pay

## 2017-07-16 DIAGNOSIS — Z79899 Other long term (current) drug therapy: Secondary | ICD-10-CM | POA: Diagnosis not present

## 2017-07-16 DIAGNOSIS — R1032 Left lower quadrant pain: Secondary | ICD-10-CM | POA: Diagnosis not present

## 2017-07-16 DIAGNOSIS — Z87891 Personal history of nicotine dependence: Secondary | ICD-10-CM | POA: Diagnosis not present

## 2017-07-16 DIAGNOSIS — Z6841 Body Mass Index (BMI) 40.0 and over, adult: Secondary | ICD-10-CM | POA: Diagnosis not present

## 2017-07-16 DIAGNOSIS — K5792 Diverticulitis of intestine, part unspecified, without perforation or abscess without bleeding: Secondary | ICD-10-CM

## 2017-07-16 DIAGNOSIS — K630 Abscess of intestine: Secondary | ICD-10-CM

## 2017-07-16 DIAGNOSIS — I10 Essential (primary) hypertension: Secondary | ICD-10-CM | POA: Diagnosis present

## 2017-07-16 DIAGNOSIS — K5900 Constipation, unspecified: Secondary | ICD-10-CM | POA: Diagnosis present

## 2017-07-16 DIAGNOSIS — K5720 Diverticulitis of large intestine with perforation and abscess without bleeding: Secondary | ICD-10-CM | POA: Diagnosis present

## 2017-07-16 LAB — URINALYSIS, COMPLETE (UACMP) WITH MICROSCOPIC
Bilirubin Urine: NEGATIVE
Glucose, UA: NEGATIVE mg/dL
Hgb urine dipstick: NEGATIVE
Ketones, ur: 80 mg/dL — AB
Nitrite: NEGATIVE
Protein, ur: 30 mg/dL — AB
Specific Gravity, Urine: 1.026 (ref 1.005–1.030)
pH: 6 (ref 5.0–8.0)

## 2017-07-16 LAB — CBC
HEMATOCRIT: 34 % — AB (ref 35.0–47.0)
Hemoglobin: 11.3 g/dL — ABNORMAL LOW (ref 12.0–16.0)
MCH: 29.5 pg (ref 26.0–34.0)
MCHC: 33.4 g/dL (ref 32.0–36.0)
MCV: 88.4 fL (ref 80.0–100.0)
PLATELETS: 264 10*3/uL (ref 150–440)
RBC: 3.84 MIL/uL (ref 3.80–5.20)
RDW: 14 % (ref 11.5–14.5)
WBC: 11.3 10*3/uL — ABNORMAL HIGH (ref 3.6–11.0)

## 2017-07-16 LAB — COMPREHENSIVE METABOLIC PANEL
ALBUMIN: 2.9 g/dL — AB (ref 3.5–5.0)
ALT: 11 U/L — AB (ref 14–54)
AST: 14 U/L — AB (ref 15–41)
Alkaline Phosphatase: 46 U/L (ref 38–126)
Anion gap: 7 (ref 5–15)
BILIRUBIN TOTAL: 0.9 mg/dL (ref 0.3–1.2)
BUN: 8 mg/dL (ref 6–20)
CO2: 25 mmol/L (ref 22–32)
CREATININE: 0.92 mg/dL (ref 0.44–1.00)
Calcium: 8.1 mg/dL — ABNORMAL LOW (ref 8.9–10.3)
Chloride: 102 mmol/L (ref 101–111)
GFR calc Af Amer: >60 mL/min (ref >60)
GLUCOSE: 90 mg/dL (ref 65–99)
POTASSIUM: 3.6 mmol/L (ref 3.5–5.1)
Sodium: 134 mmol/L — ABNORMAL LOW (ref 135–145)
TOTAL PROTEIN: 6.6 g/dL (ref 6.5–8.1)

## 2017-07-16 LAB — HCG, QUANTITATIVE, PREGNANCY: hCG, Beta Chain, Quant, S: <1 m[IU]/mL (ref <5)

## 2017-07-16 MED ORDER — PIPERACILLIN-TAZOBACTAM 3.375 G IVPB 30 MIN
3.3750 g | Freq: Once | INTRAVENOUS | Status: AC
  Administered 2017-07-16: 3.375 g via INTRAVENOUS
  Filled 2017-07-16: qty 50

## 2017-07-16 MED ORDER — HYDRALAZINE HCL 20 MG/ML IJ SOLN: 10.0000 mg | INTRAMUSCULAR | Status: DC | PRN

## 2017-07-16 MED ORDER — PIPERACILLIN-TAZOBACTAM 3.375 G IVPB
3.3750 g | Freq: Three times a day (TID) | INTRAVENOUS | Status: DC
  Administered 2017-07-16 – 2017-07-18 (×7): 3.375 g via INTRAVENOUS
  Filled 2017-07-16 (×7): qty 50

## 2017-07-16 MED ORDER — ONDANSETRON HCL 4 MG/2ML IJ SOLN
4.0000 mg | Freq: Once | INTRAMUSCULAR | Status: AC
  Administered 2017-07-16: 4 mg via INTRAVENOUS
  Filled 2017-07-16: qty 2

## 2017-07-16 MED ORDER — IOPAMIDOL (ISOVUE-300) INJECTION 61%
125.0000 mL | Freq: Once | INTRAVENOUS | Status: AC | PRN
  Administered 2017-07-16: 125 mL via INTRAVENOUS

## 2017-07-16 MED ORDER — OXYCODONE HCL 5 MG PO TABS: 5.0000 mg | ORAL_TABLET | ORAL | Status: DC | PRN

## 2017-07-16 MED ORDER — KETOROLAC TROMETHAMINE 30 MG/ML IJ SOLN
30.0000 mg | Freq: Four times a day (QID) | INTRAMUSCULAR | Status: DC | PRN
  Administered 2017-07-17: 30 mg via INTRAVENOUS
  Filled 2017-07-16: qty 1

## 2017-07-16 MED ORDER — MORPHINE SULFATE (PF) 4 MG/ML IV SOLN
4.0000 mg | INTRAVENOUS | Status: DC | PRN
  Administered 2017-07-16 – 2017-07-17 (×3): 4 mg via INTRAVENOUS
  Filled 2017-07-16 (×3): qty 1

## 2017-07-16 MED ORDER — ONDANSETRON HCL 4 MG/2ML IJ SOLN: 4.0000 mg | Freq: Four times a day (QID) | INTRAMUSCULAR | Status: DC | PRN

## 2017-07-16 MED ORDER — FAMOTIDINE IN NACL 20-0.9 MG/50ML-% IV SOLN
20.0000 mg | Freq: Two times a day (BID) | INTRAVENOUS | Status: DC
  Administered 2017-07-16 – 2017-07-17 (×4): 20 mg via INTRAVENOUS
  Filled 2017-07-16 (×4): qty 50

## 2017-07-16 MED ORDER — DIPHENHYDRAMINE HCL 12.5 MG/5ML PO ELIX
12.5000 mg | ORAL_SOLUTION | Freq: Four times a day (QID) | ORAL | Status: DC | PRN
  Filled 2017-07-16: qty 5

## 2017-07-16 MED ORDER — HEPARIN SODIUM (PORCINE) 5000 UNIT/ML IJ SOLN
5000.0000 [IU] | Freq: Three times a day (TID) | INTRAMUSCULAR | Status: DC
  Administered 2017-07-16 – 2017-07-18 (×6): 5000 [IU] via SUBCUTANEOUS
  Filled 2017-07-16 (×7): qty 1

## 2017-07-16 MED ORDER — MORPHINE SULFATE (PF) 4 MG/ML IV SOLN
6.0000 mg | Freq: Once | INTRAVENOUS | Status: AC
  Administered 2017-07-16: 6 mg via INTRAVENOUS
  Filled 2017-07-16: qty 2

## 2017-07-16 MED ORDER — IOPAMIDOL (ISOVUE-300) INJECTION 61%
30.0000 mL | Freq: Once | INTRAVENOUS | Status: AC | PRN
  Administered 2017-07-16: 30 mL via ORAL

## 2017-07-16 MED ORDER — ACETAMINOPHEN 500 MG PO TABS
1000.0000 mg | ORAL_TABLET | Freq: Four times a day (QID) | ORAL | Status: DC
  Administered 2017-07-16 – 2017-07-18 (×10): 1000 mg via ORAL
  Filled 2017-07-16 (×10): qty 2

## 2017-07-16 MED ORDER — SODIUM CHLORIDE 0.9 % IV BOLUS
1000.0000 mL | Freq: Once | INTRAVENOUS | Status: AC
  Administered 2017-07-16: 1000 mL via INTRAVENOUS

## 2017-07-16 MED ORDER — ONDANSETRON 4 MG PO TBDP: 4.0000 mg | ORAL_TABLET | Freq: Four times a day (QID) | ORAL | Status: DC | PRN

## 2017-07-16 MED ORDER — DIPHENHYDRAMINE HCL 50 MG/ML IJ SOLN: 12.5000 mg | Freq: Four times a day (QID) | INTRAMUSCULAR | Status: DC | PRN

## 2017-07-16 MED ORDER — LACTATED RINGERS IV SOLN
INTRAVENOUS | Status: DC
  Administered 2017-07-16 – 2017-07-18 (×6): via INTRAVENOUS

## 2017-07-16 NOTE — Progress Notes (Signed)
SURGICAL PROGRESS NOTE (cpt [phone])  Hospital Day(s): 0.   Post op day(s):  Marland Kitchen   Interval History: Patient seen and examined, no acute events or new complaints overnight. Patient reports her pain has been improving vs controlled, and she denies fever/chills, N/V, CP, or SOB, requests to be able to eat/drink. She also says she has been +flatus and ambulating without difficulty.  Review of Systems:  Constitutional: denies fever, chills  HEENT: denies cough or congestion  Respiratory: denies any shortness of breath  Cardiovascular: denies chest pain or palpitations  Gastrointestinal: abdominal pain, N/V, and bowel function as per interval history Genitourinary: denies burning with urination or urinary frequency Musculoskeletal: denies pain, decreased motor or sensation Integumentary: denies any other rashes or skin discolorations Neurological: denies HA or vision/hearing changes   Vital signs in last 24 hours: [min-max] current  Temp:  [98.8 F (37.1 C)-99.7 F (37.6 C)] 99.7 F (37.6 C) (04/26 0438) Pulse Rate:  [92-109] 106 (04/26 0438) Resp:  [16-20] 20 (04/26 0438) BP: (114-184)/(76-113) 147/89 (04/26 0438) SpO2:  [97 %-98 %] 97 % (04/26 0438) Weight:  [282 lb (127.9 kg)-282 lb 13.6 oz (128.3 kg)] 282 lb 13.6 oz (128.3 kg) (04/26 0438)     Height: 5\' 3"  (160 cm) Weight: 282 lb 13.6 oz (128.3 kg) BMI (Calculated): 50.12   Intake/Output this shift:  No intake/output data recorded.   Intake/Output last 2 shifts:  @IOLAST2SHIFTS @   Physical Exam:  Constitutional: alert, cooperative and no distress  HENT: normocephalic without obvious abnormality  Eyes: PERRL, EOM's grossly intact and symmetric  Neuro: CN II - XII grossly intact and symmetric without deficit  Respiratory: breathing non-labored at rest  Cardiovascular: regular rate and sinus rhythm  Gastrointestinal: soft, obese, and non-distended with mild (much improved) LLQ tenderness to palpation Musculoskeletal: UE and  LE FROM, no edema or wounds, motor and sensation grossly intact, NT   Labs:  CBC Latest Ref Rng & Units 07/16/2017 07/15/2017 07/14/2017  WBC 3.6 - 11.0 K/uL 11.3(H) 12.0(H) 8.7  Hemoglobin 12.0 - 16.0 g/dL 11.3(L) 13.3 12.7  Hematocrit 35.0 - 47.0 % 34.0(L) 39.3 36.2  Platelets 150 - 440 K/uL 264 279 251   CMP Latest Ref Rng & Units 07/16/2017 07/15/2017 05/05/2017  Glucose 65 - 99 mg/dL 90 99 93  BUN 6 - 20 mg/dL 8 9 14   Creatinine 0.44 - 1.00 mg/dL 6.04 5.40 9.81(X)  Sodium 135 - 145 mmol/L 134(L) 135 134(L)  Potassium 3.5 - 5.1 mmol/L 3.6 3.8 3.8  Chloride 101 - 111 mmol/L 102 102 100(L)  CO2 22 - 32 mmol/L 25 24 25   Calcium 8.9 - 10.3 mg/dL 8.1(L) 8.9 8.5(L)  Total Protein 6.5 - 8.1 g/dL 6.6 7.7 -  Total Bilirubin 0.3 - 1.2 mg/dL 0.9 0.7 -  Alkaline Phos 38 - 126 U/L 46 51 -  AST 15 - 41 U/L 14(L) 15 -  ALT 14 - 54 U/L 11(L) 13(L) -   Imaging studies: No new pertinent imaging studies, though admission CT personally reviewed and compared to prior studies   Assessment/Plan: (ICD-10's: K33.20) 37 y.o. female with persistent vs recurrent sigmoid colonic diverticulitis with intramural abscess despite prolonged course of antibiotics, complicated by pertinent comorbidities including HTN and morbid obesity (BMI >50) despite 35 lbs weight loss by modifications of patient's diet and activity.   - IV antibiotic (Zosyn)  - will advance to clear liquids diet  - pain control as needed (minimize narcotics)  - possibilities of colectomy prior to discharge vs  2 weeks antibiotics followed by colectomy were dicussed at length  - if patient does go home with antibiotics, further weight loss while maintaining nutrition encouraged  - DVT prophylaxis, ambulation encouraged  All of the above findings and recommendations were discussed with the patient, and all of patient's questions were answered to her expressed satisfaction.  -- Scherrie GerlachJason E. Earlene Plateravis, MD, RPVI [place]: [facility] - Partnering for exceptional care. Office: [phone]

## 2017-07-16 NOTE — ED Notes (Signed)
Per Dr Everlene FarrierPabon, pt to remain in ED until CT can be performed; Viviann SpareSteven, 2C RN, made aware.

## 2017-07-16 NOTE — ED Notes (Signed)
Report was given.  Waiting for CT for pt to go up

## 2017-07-16 NOTE — ED Notes (Signed)
Patient transported to CT at this time. 

## 2017-07-16 NOTE — ED Provider Notes (Signed)
[facility] Emergency Department Provider Note  ____________________________________________   First MD Initiated Contact with Patient 07/16/17 0033     (approximate)  I have reviewed the triage vital signs and the nursing notes.   HISTORY  Chief Complaint Abdominal Pain   HPI Meredith Myers is a 37 y.o. female who comes to the emergency department with several days of worsening lower abdominal pain.  2 months ago she was admitted to our hospital for diverticulitis with focal perforation.  She was treated with antibiotics and initially improved and was discharged home.  Her pain is worsened over the past week or so and about 36 hours ago she was seen in general surgery clinic where she had a CT scan of her abdomen pelvis diverticulitis with an intramural abscess.  She was begun on Augmentin and sent home.  She comes to the emergency department tonight because her pain is worsened.  She has had some constipation.  No diarrhea.  No fevers or chills.  Her pain is in her left lower quadrant moderate to severe worse with movement improved with rest.  Nonradiating.  Past Medical History:  Diagnosis Date  . Diverticulitis 2019  . Hypertension     Patient Active Problem List   Diagnosis Date Noted  . Diverticulitis of large intestine with abscess 07/16/2017  . Diverticulitis of colon (without mention of hemorrhage)(562.11)   . Abdominal pain   . Diverticulitis 05/04/2017    Past Surgical History:  Procedure Laterality Date  . ablation of uterus      Prior to Admission medications   Medication Sig Start Date End Date Taking? Authorizing Provider  amLODipine (NORVASC) 5 MG tablet Take 1 tablet (5 mg total) by mouth daily. 04/24/17 06/23/17  Jene Every, MD  amoxicillin-clavulanate (AUGMENTIN) 875-125 MG tablet Take 1 tablet by mouth 2 (two) times daily. 05/24/17   [name], MD  amoxicillin-clavulanate (AUGMENTIN) 875-125 MG tablet Take 1 tablet by mouth  2 (two) times daily. 07/14/17   Ancil Linsey, MD  hydrochlorothiazide (HYDRODIURIL) 25 MG tablet Take 1 tablet (25 mg total) by mouth daily. 04/24/17   Jene Every, MD    Allergies Patient has no known allergies.  No family history on file.  Social History Social History   Tobacco Use  . Smoking status: Former Smoker    Types: Cigarettes  . Smokeless tobacco: Never Used  Substance Use Topics  . Alcohol use: No    Frequency: Never  . Drug use: No    Review of Systems Constitutional: No fever/chills Eyes: No visual changes. ENT: No sore throat. Cardiovascular: Denies chest pain. Respiratory: Denies shortness of breath. Gastrointestinal: Positive for abdominal pain.  Positive for nausea, no vomiting.  No diarrhea.  Positive for constipation. Genitourinary: Negative for dysuria. Musculoskeletal: Negative for back pain. Skin: Negative for rash. Neurological: Negative for headaches, focal weakness or numbness.   ____________________________________________   PHYSICAL EXAM:  VITAL SIGNS: ED Triage Vitals  Enc Vitals Group     BP 07/15/17 2017 (!) 184/113     Pulse Rate 07/15/17 2017 (!) 109     Resp 07/15/17 2017 18     Temp 07/15/17 2017 98.8 F (37.1 C)     Temp Source 07/15/17 2017 Oral     SpO2 07/15/17 2017 98 %     Weight 07/15/17 2018 282 lb (127.9 kg)     Height --      Head Circumference --      Peak Flow --  Pain Score 07/15/17 2018 7     Pain Loc --      Pain Edu? --      Excl. in GC? --     Constitutional: Alert and oriented x4 appears quite uncomfortable nontoxic no diaphoresis speaks full clear sentences Eyes: PERRL EOMI. Head: Atraumatic. Nose: No congestion/rhinnorhea. Mouth/Throat: No trismus Neck: No stridor.   Cardiovascular: Tachycardic rate, regular rhythm. Grossly normal heart sounds.  Good peripheral circulation. Respiratory: Normal respiratory effort.  No retractions. Lungs CTAB and moving good air Gastrointestinal:  Morbidly obese quite tender in the left lower quadrant with rebound and guarding although no frank peritonitis Musculoskeletal: No lower extremity edema   Neurologic:  Normal speech and language. No gross focal neurologic deficits are appreciated. Skin:  Skin is warm, dry and intact. No rash noted. Psychiatric: Mood and affect are normal. Speech and behavior are normal.    ____________________________________________   DIFFERENTIAL includes but not limited to  Diverticulitis, abscess, perforation, appendicitis, ectopic pregnancy ____________________________________________   LABS (all labs ordered are listed, but only abnormal results are displayed)  Labs Reviewed  COMPREHENSIVE METABOLIC PANEL - Abnormal; Notable for the following components:      Result Value   ALT 13 (*)    All other components within normal limits  CBC - Abnormal; Notable for the following components:   WBC 12.0 (*)    All other components within normal limits  URINALYSIS, COMPLETE (UACMP) WITH MICROSCOPIC - Abnormal; Notable for the following components:   Color, Urine AMBER (*)    APPearance HAZY (*)    Ketones, ur 80 (*)    Protein, ur 30 (*)    Leukocytes, UA LARGE (*)    Bacteria, UA RARE (*)    All other components within normal limits  LIPASE, BLOOD  HCG, QUANTITATIVE, PREGNANCY  POC URINE PREG, ED    Lab work reviewed by me shows elevated white count concerning for infection.  Urinalysis with large ketones suggestive of starvation __________________________________________  EKG   ____________________________________________  RADIOLOGY  CT abdomen pelvis pending at this time ____________________________________________   PROCEDURES  Procedure(s) performed: no  Procedures  Critical Care performed: no  Observation: no ____________________________________________   INITIAL IMPRESSION / ASSESSMENT AND PLAN / ED COURSE  Pertinent labs & imaging results that were available  during my care of the patient were reviewed by me and considered in my medical decision making (see chart for details).       ----------------------------------------- 1:22 AM on 07/16/2017 -----------------------------------------  The patient is tachycardic with worsening abdominal pain and tenderness despite taking appropriate oral antibiotics.  I discussed with Dr. Everlene FarrierPabon who agrees with the CT scan abdomen and pelvis today even though she just had one a day and a half ago given her clinical deterioration. ____________________________________________  ----------------------------------------- 2:24 AM on 07/16/2017 -----------------------------------------  Dr. Everlene FarrierPabon has seen and evaluated the patient and he has graciously agreed to admit her to his service.  After morphine the patient's pain is improved.  She verbalizes understanding and agreement with the plan.  FINAL CLINICAL IMPRESSION(S) / ED DIAGNOSES  Final diagnoses:  Diverticulitis  Intestinal diverticular abscess      NEW MEDICATIONS STARTED DURING THIS VISIT:  New Prescriptions   No medications on file     Note:  This document was prepared using Dragon voice recognition software and may include unintentional dictation errors.     Merrily Brittleifenbark, Deveron Shamoon, MD 07/16/17 [id number]

## 2017-07-16 NOTE — ED Notes (Signed)
Call to lab to add quant

## 2017-07-16 NOTE — H&P (Signed)
Patient ID: Meredith Myers, female   DOB: [birth year], 37 y.o.   MRN: [id number]  HPI Meredith Myers is a 37 y.o. female well-known to our service with a history of recurrent diverticulitis with abscess.  Initially hospitalized a few months ago and did well.  She had 1 month interval free of disease and more recently had symptoms again.  She reports that she has been having left lower quadrant abdominal pain for the last 5 days.  Pain now is getting worse over the last 24 to 48 hours.  Pain is not radiated.  Some nausea but no vomiting.  No fevers no chills.  She did have a CT scan 2 days ago showing evidence of worsening intramural abscess measuring 4 cm.  No evidence of free air or free perforation. White count today is 12,000 normal creatinine. She never had any abdominal operations.  She knows that she has lost about 35 pounds deliberately over the last few months.  HPI  Past Medical History:  Diagnosis Date  . Diverticulitis 2019  . Hypertension     Past Surgical History:  Procedure Laterality Date  . ablation of uterus      No family history on file.  Social History Social History   Tobacco Use  . Smoking status: Former Smoker    Types: Cigarettes  . Smokeless tobacco: Never Used  Substance Use Topics  . Alcohol use: No    Frequency: Never  . Drug use: No    No Known Allergies  Current Facility-Administered Medications  Medication Dose Route Frequency Provider Last Rate Last Dose  . piperacillin-tazobactam (ZOSYN) IVPB 3.375 g  3.375 g Intravenous Once Merrily Brittleifenbark, Neil, MD 100 mL/hr at 07/16/17 0133 3.375 g at 07/16/17 0133  . sodium chloride 0.9 % bolus 1,000 mL  1,000 mL Intravenous Once Sterling BigPabon, Erhard Senske F, MD 983.6 mL/hr at 07/16/17 0133 1,000 mL at 07/16/17 0133   Current Outpatient Medications  Medication Sig Dispense Refill  . amLODipine (NORVASC) 5 MG tablet Take 1 tablet (5 mg total) by mouth daily. 30 tablet 1  . amoxicillin-clavulanate (AUGMENTIN) 875-125 MG  tablet Take 1 tablet by mouth 2 (two) times daily. 14 tablet 0  . amoxicillin-clavulanate (AUGMENTIN) 875-125 MG tablet Take 1 tablet by mouth 2 (two) times daily. 28 tablet 0  . hydrochlorothiazide (HYDRODIURIL) 25 MG tablet Take 1 tablet (25 mg total) by mouth daily. 30 tablet 1     Review of Systems Full ROS  was asked and was negative except for the information on the HPI  Physical Exam Blood pressure (!) 154/101, pulse (!) 109, temperature 98.8 F (37.1 C), temperature source Oral, resp. rate 16, weight 127.9 kg (282 lb), SpO2 97 %. CONSTITUTIONAL: This female no acute distress EYES: Pupils are equal, round, and reactive to light, Sclera are non-icteric. EARS, NOSE, MOUTH AND THROAT: The oropharynx is clear. The oral mucosa is pink and moist. Hearing is intact to voice. LYMPH NODES:  Lymph nodes in the neck are normal. RESPIRATORY:  Lungs are clear. There is normal respiratory effort, with equal breath sounds bilaterally, and without pathologic use of accessory muscles. CARDIOVASCULAR: Heart is regular without murmurs, gallops, or rubs. GI: The abdomen is  soft, large pannus there is tenderness to palpation of left lower quadrant suprapubic area.  No peritonitis and no rebound GU: Rectal deferred.   MUSCULOSKELETAL: Normal muscle strength and tone. No cyanosis or edema.   SKIN: Turgor is good and there are no pathologic skin lesions or ulcers. NEUROLOGIC: Motor  and sensation is grossly normal. Cranial nerves are grossly intact. PSYCH:  Oriented to person, place and time. Affect is normal.  Data Reviewed  I have personally reviewed the patient's imaging, laboratory findings and medical records.    Assessment/Plan  37 year old morbidly obese female with recurrent diverticulitis with abscesses. Failed outpatient robotic therapy.  She will need to be admitted to the hospital.  Keep n.p.o. fluid resuscitation. Given the short run the recurrence and they were synapses I do think that  she will likely benefit from surgical intervention during this hospitalization.  Question will be whether she will be amenable for sigmoid colectomy with anastomosis and a diverting loop versus Hartman's procedure. Gust with her in detail and she wants to try to avoid an ostomy.  On the other hand her intramural abscess is worse and has failed outpatient therapy.  We will repeat the CT scan to have a better idea on the current situation. There is no need for any emergent surgical operations tonight .  Sterling Big, MD FACS General Surgeon 07/16/2017, 1:49 AM

## 2017-07-17 LAB — CBC
HEMATOCRIT: 33.1 % — AB (ref 35.0–47.0)
Hemoglobin: 11.1 g/dL — ABNORMAL LOW (ref 12.0–16.0)
MCH: 29.9 pg (ref 26.0–34.0)
MCHC: 33.6 g/dL (ref 32.0–36.0)
MCV: 89.1 fL (ref 80.0–100.0)
Platelets: 238 10*3/uL (ref 150–440)
RBC: 3.71 MIL/uL — ABNORMAL LOW (ref 3.80–5.20)
RDW: 13.8 % (ref 11.5–14.5)
WBC: 8.5 10*3/uL (ref 3.6–11.0)

## 2017-07-17 LAB — BASIC METABOLIC PANEL
Anion gap: 6 (ref 5–15)
BUN: 6 mg/dL (ref 6–20)
CHLORIDE: 106 mmol/L (ref 101–111)
CO2: 24 mmol/L (ref 22–32)
Calcium: 8.3 mg/dL — ABNORMAL LOW (ref 8.9–10.3)
Creatinine, Ser: 0.89 mg/dL (ref 0.44–1.00)
GFR calc Af Amer: >60 mL/min (ref >60)
GFR calc non Af Amer: >60 mL/min (ref >60)
Glucose, Bld: 75 mg/dL (ref 65–99)
Potassium: 4 mmol/L (ref 3.5–5.1)
SODIUM: 136 mmol/L (ref 135–145)

## 2017-07-17 LAB — HIV ANTIBODY (ROUTINE TESTING W REFLEX): HIV Screen 4th Generation wRfx: NONREACTIVE

## 2017-07-17 NOTE — Progress Notes (Signed)
SURGICAL PROGRESS NOTE (cpt [phone])  Hospital Day(s): 1.   Post op day(s):  Marland Kitchen   Interval History: Patient seen and examined, no acute events or new complaints overnight. Patient reports her abdominal pain has continued to improve significantly, and she has been tolerating clear liquids diet and ambulation. She also describes having had a moderately large soft, formed BM this morning in addition to her already having been passing flatus. She reported some pain with her BM, but that has since resolved. Patient otherwise denies fever/chills, N/V, CP, or SOB.  Review of Systems:  Constitutional: denies fever, chills  HEENT: denies cough or congestion  Respiratory: denies any shortness of breath  Cardiovascular: denies chest pain or palpitations  Gastrointestinal: abdominal pain, N/V, and bowel function as per interval history Genitourinary: denies burning with urination or urinary frequency Musculoskeletal: denies pain, decreased motor or sensation Integumentary: denies any other rashes or skin discolorations Neurological: denies HA or vision/hearing changes   Vital signs in last 24 hours: [min-max] current  Temp:  [98.2 F (36.8 C)-98.6 F (37 C)] 98.2 F (36.8 C) (04/27 0452) Pulse Rate:  [80-84] 82 (04/27 0452) Resp:  [17-20] 20 (04/27 0452) BP: (107-129)/(67-70) 108/67 (04/27 0452) SpO2:  [97 %-100 %] 100 % (04/27 0452)     Height:  (160 cm) Weight: 282 lb 13.6 oz (128.3 kg) BMI (Calculated): 50.12   Intake/Output this shift:  No intake/output data recorded.   Intake/Output last 2 shifts:  @   Physical Exam:  Constitutional: alert, cooperative and no distress  HENT: normocephalic without obvious abnormality  Eyes: PERRL, EOM's grossly intact and symmetric  Neuro: CN II - XII grossly intact and symmetric without deficit  Respiratory: breathing non-labored at rest  Cardiovascular: regular rate and sinus rhythm  Gastrointestinal: soft and obese, but  non-distended with minimal LLQ abdominal tenderness to deep palpation Musculoskeletal: UE and LE FROM, no obvious wounds, motor and sensation grossly intact, NT   Labs:  CBC Latest Ref Rng & Units 07/17/2017 07/16/2017 07/15/2017  WBC 3.6 - 11.0 K/uL 8.5 11.3(H) 12.0(H)  Hemoglobin 12.0 - 16.0 g/dL 11.1(L) 11.3(L) 13.3  Hematocrit 35.0 - 47.0 % 33.1(L) 34.0(L) 39.3  Platelets 150 - 440 K/uL 238 264 279   CMP Latest Ref Rng & Units 07/17/2017 07/16/2017 07/15/2017  Glucose 65 - 99 mg/dL 75 90 99  BUN 6 - 20 mg/dL Creatinine 0.44 - 1.00 mg/dL 6.04 5.40 9.81  Sodium 135 - 145 mmol/L 136 134(L) 135  Potassium 3.5 - 5.1 mmol/L 4.0 3.6 3.8  Chloride 101 - 111 mmol/L 106 102 102  CO2 22 - 32 mmol/L Calcium 8.9 - 10.3 mg/dL 8.3(L) 8.1(L) 8.9  Total Protein 6.5 - 8.1 g/dL - 6.6 7.7  Total Bilirubin 0.3 - 1.2 mg/dL - 0.9 0.7  Alkaline Phos 38 - 126 U/L - 46 51  AST 15 - 41 U/L - 14(L) 15  ALT 14 - 54 U/L - 11(L) 13(L)   Imaging studies: No new pertinent imaging studies   Assessment/Plan: (ICD-10's: K30.20) 37 y.o. female with persistent vs recurrent sigmoid colonic diverticulitis with intramural abscess despite prolonged course of antibiotics, complicated by pertinent comorbidities including HTN and morbid obesity (BMI >50) despite 35 lbs weight loss by modifications of patient's diet and activity.              - IV antibiotic (Zosyn)             - advanced to  full liquids diet             - pain control as needed (minimize narcotics)             - possibilities of colectomy prior to discharge vs antibiotics x 2 weeks followed by colectomy were dicussed at length             - if patient does go home with antibiotics, further weight loss while maintaining nutrition was encouraged             - DVT prophylaxis, ambulation encouraged  All of the above findings and recommendations were discussed with the patient, and all of patient's questions were answered to her expressed  satisfaction.  -- Scherrie Gerlach Earlene Plater, MD, RPVI [place]: [facility] General [facility] - Partnering for exceptional care. Office: [phone]

## 2017-07-18 LAB — CBC
HEMATOCRIT: 33.6 % — AB (ref 35.0–47.0)
Hemoglobin: 11.6 g/dL — ABNORMAL LOW (ref 12.0–16.0)
MCH: 30.7 pg (ref 26.0–34.0)
MCHC: 34.4 g/dL (ref 32.0–36.0)
MCV: 89.1 fL (ref 80.0–100.0)
PLATELETS: 262 10*3/uL (ref 150–440)
RBC: 3.77 MIL/uL — AB (ref 3.80–5.20)
RDW: 13.6 % (ref 11.5–14.5)
WBC: 7.6 10*3/uL (ref 3.6–11.0)

## 2017-07-18 MED ORDER — AMOXICILLIN-POT CLAVULANATE 875-125 MG PO TABS: 1.0000 | ORAL_TABLET | Freq: Two times a day (BID) | ORAL | 0 refills | Status: AC

## 2017-07-18 MED ORDER — AMLODIPINE BESYLATE 5 MG PO TABS
5.0000 mg | ORAL_TABLET | Freq: Every day | ORAL | Status: DC
  Administered 2017-07-18: 5 mg via ORAL
  Filled 2017-07-18: qty 1

## 2017-07-18 MED ORDER — OXYCODONE HCL 5 MG PO TABS: 5.0000 mg | ORAL_TABLET | Freq: Four times a day (QID) | ORAL | Status: DC | PRN

## 2017-07-19 ENCOUNTER — Telehealth: Payer: Self-pay | Admitting: Surgery

## 2017-07-19 NOTE — Discharge Summary (Signed)
Physician Discharge Summary  Patient ID: Meredith Myers MRN: [id number] DOB/AGE: [birth year] 37 y.o.  Admit date: 07/16/2017 Discharge date: 07/18/2017  Admission Diagnoses:  Discharge Diagnoses:  Active Problems:   Diverticulitis of large intestine with abscess   Discharged Condition: good  Hospital Course: 37 y.o. female presented to [facility] after she was seen in the surgery office, diagnosed with persistent/recurrent sigmoid colonic diverticulitis with intramural abscess, after which her pain increased and became intolerable despite resumption of oral antibiotics. Patient was admitted and made NPO with IV antibiotics and pain control, after which her pain quickly improved with improved bowel function. Surgery before discharge vs outpatient antibiotics and planned surgery in the next few weeks were discussed, and patient elected to be discharged home with close surgical follow-up. Advancement of patient's diet and ambulation were well-tolerated. The remainder of patient's hospital course was essentially unremarkable, and discharge planning was initiated accordingly with patient safely able to be discharged home with appropriate discharge instructions, antibiotics, and outpatient surgical follow-up after all of her questions were answered to her expressed satisfaction.  Consults: None  Treatments: antibiotics: Zosyn  Discharge Exam: Blood pressure 125/79, pulse 74, temperature 98.3 F (36.8 C), temperature source Oral, resp. rate 18, height  (1.6 m), weight 282 lb 13.6 oz (128.3 kg), SpO2 99 %. General appearance: alert, cooperative and no distress GI: abdomen soft and obese though non-distended with nearly resolved LLQ abdominal tenderness to deep palpation  Disposition:    Allergies as of 07/18/2017   No Known Allergies     Medication List    TAKE these medications   amLODipine 5 MG tablet Commonly known as:  NORVASC Take 5 mg by mouth daily.   amLODipine 5 MG  tablet Commonly known as:  NORVASC Take 1 tablet (5 mg total) by mouth daily.   amoxicillin-clavulanate 875-125 MG tablet Commonly known as:  AUGMENTIN Take 1 tablet by mouth 2 (two) times daily for 14 days.   hydrochlorothiazide 25 MG tablet Commonly known as:  HYDRODIURIL Take 1 tablet (25 mg total) by mouth daily.      Follow-up Information    Ancil Linsey, MD. Schedule an appointment as soon as possible for a visit in 1 week(s).   Specialty:  General Surgery Contact information: [address] [place] [address] [phone]           Signed: Ancil Linsey 07/19/2017, 7:30 PM

## 2017-07-19 NOTE — Telephone Encounter (Signed)
Patient needs a note for work. She was admitted and has a follow up on 5/8 with Dr. Earlene Plater.

## 2017-07-22 ENCOUNTER — Ambulatory Visit: Payer: Self-pay | Admitting: Surgery

## 2017-07-28 ENCOUNTER — Ambulatory Visit (INDEPENDENT_AMBULATORY_CARE_PROVIDER_SITE_OTHER): Payer: MEDICAID | Admitting: Surgery

## 2017-07-28 ENCOUNTER — Encounter: Payer: Self-pay | Admitting: Surgery

## 2017-07-28 VITALS — BP 138/96 | HR 89 | Resp 14 | Ht 64.0 in | Wt 282.0 lb

## 2017-07-28 DIAGNOSIS — K5792 Diverticulitis of intestine, part unspecified, without perforation or abscess without bleeding: Secondary | ICD-10-CM

## 2017-07-28 MED ORDER — BISACODYL 5 MG PO TBEC: 5.0000 mg | DELAYED_RELEASE_TABLET | Freq: Once | ORAL | 0 refills | Status: AC

## 2017-07-28 MED ORDER — AMOXICILLIN-POT CLAVULANATE 875-125 MG PO TABS: 1.0000 | ORAL_TABLET | Freq: Two times a day (BID) | ORAL | 0 refills | Status: DC

## 2017-07-28 MED ORDER — NEOMYCIN SULFATE 500 MG PO TABS: 500.0000 mg | ORAL_TABLET | Freq: Three times a day (TID) | ORAL | 0 refills | Status: DC

## 2017-07-28 MED ORDER — ERYTHROMYCIN BASE 500 MG PO TABS: 500.0000 mg | ORAL_TABLET | Freq: Three times a day (TID) | ORAL | 0 refills | Status: AC

## 2017-07-28 MED ORDER — POLYETHYLENE GLYCOL 3350 17 GM/SCOOP PO POWD: 1.0000 | Freq: Once | ORAL | 0 refills | Status: AC

## 2017-07-28 NOTE — Patient Instructions (Addendum)
Surgery scheduled for Aug 10, 2017. Amoxicillin sent to your pharmacy. Bowel pre sent to pharmacy too.

## 2017-07-28 NOTE — Progress Notes (Signed)
Surgical Clinic Progress/Follow-up Note   HPI:  37 y.o. Female presents to clinic for follow-up evaluation of recurrent vs persistent sigmoid colonic diverticulitis with intramural sigmoid colonic abscess despite previous prolonged antibiotics. Patient reports she's feeling better with her LLQ pain near-completely resolved, though she describes that she "can occasionally feel something" at her LLQ with soft BM's with soft diet + antibiotics, denies constipation, fever/chills, N/V, CP, or SOB.  Review of Systems:  Constitutional: denies any other weight loss, fever, chills, or sweats  Eyes: denies any other vision changes, history of eye injury  ENT: denies sore throat, hearing problems  Respiratory: denies shortness of breath, wheezing  Cardiovascular: denies chest pain, palpitations  Gastrointestinal: abdominal pain, N/V, and bowel function as per HPI Musculoskeletal: denies any other joint pains or cramps  Skin: Denies any other rashes or skin discolorations  Neurological: denies any other headache, dizziness, weakness  Psychiatric: denies any other depression, anxiety  All other review of systems: otherwise negative   Vital Signs:  BP (!) 138/96   Pulse 89   Resp 14   Ht 5' 4" (1.626 m)   Wt 282 lb (127.9 kg)   BMI 48.41 kg/m    Physical Exam:  Constitutional:  -- Obese body habitus  -- Awake, alert, and oriented x3  Eyes:  -- Pupils equally round and reactive to light  -- No scleral icterus  Ear, nose, throat:  -- No jugular venous distension  -- No nasal drainage, bleeding Pulmonary:  -- No crackles -- Equal breath sounds bilaterally -- Breathing non-labored at rest Cardiovascular:  -- S1, S2 present  -- No pericardial rubs  Gastrointestinal:  -- Soft, obese, and non-distended with minimal LLQ abdominal tenderness to palpation, no guarding/rebound  -- No abdominal masses appreciated, pulsatile or otherwise  Musculoskeletal / Integumentary:  -- Wounds or skin  discoloration: None appreciated  -- Extremities: B/L UE and LE FROM, hands and feet warm  Neurologic:  -- Motor function: intact and symmetric  -- Sensation: intact and symmetric   Assessment:  37 y.o. yo Female with a problem list including...  Patient Active Problem List   Diagnosis Date Noted  . Diverticulitis of large intestine with abscess 07/16/2017  . Diverticulitis of colon (without mention of hemorrhage)(562.11)   . Abdominal pain   . Diverticulitis 05/04/2017    presents to clinic for follow-up evaluation of recurrent vs persistent sigmoid colonic diverticulitis with intramural sigmoid colonic abscess despite previous prolonged antibiotics.  Plan:   - continue current tolerated diet  - all risks, benefits, and alternatives to partial sigmoid colectomy with possible ostomy were again discussed with the patient, all of her questions were answered to her expressed satisfaction, patient expresses she wishes to proceed, and informed consent was obtained.  - will plan for open sigmoid colectomy on Tuesday, 5/21 per patient's request and OR availability with pre-surgical prep, will request Dr. Oaks to assist  - continue antibiotics until upcoming surgery considering prior relapse/recurrence after antibiotics recently discontinued  - patient applauded for her weight loss via improved nutrition and exercise, advised regarding post-surgical restrictions  - anticipate return to clinic 2 weeks following planned surgery  - instructed to call office if any questions or concerns  All of the above recommendations were discussed with the patient, and all of patient's questions were answered to her expressed satisfaction.  -- Shaughn Thomley E. Edee Nifong, MD, RPVI [place]: [facility] General Surgery - Partnering for exceptional care. Office: [phone] 

## 2017-07-28 NOTE — H&P (View-Only) (Signed)
Surgical Clinic Progress/Follow-up Note   HPI:  37 y.o. Female presents to clinic for follow-up evaluation of recurrent vs persistent sigmoid colonic diverticulitis with intramural sigmoid colonic abscess despite previous prolonged antibiotics. Patient reports she's feeling better with her LLQ pain near-completely resolved, though she describes that she "can occasionally feel something" at her LLQ with soft BM's with soft diet + antibiotics, denies constipation, fever/chills, N/V, CP, or SOB.  Review of Systems:  Constitutional: denies any other weight loss, fever, chills, or sweats  Eyes: denies any other vision changes, history of eye injury  ENT: denies sore throat, hearing problems  Respiratory: denies shortness of breath, wheezing  Cardiovascular: denies chest pain, palpitations  Gastrointestinal: abdominal pain, N/V, and bowel function as per HPI Musculoskeletal: denies any other joint pains or cramps  Skin: Denies any other rashes or skin discolorations  Neurological: denies any other headache, dizziness, weakness  Psychiatric: denies any other depression, anxiety  All other review of systems: otherwise negative   Vital Signs:  BP (!) 138/96   Pulse 89   Resp 14   Ht  (1.626 m)   Wt 282 lb (127.9 kg)   BMI 48.41 kg/m    Physical Exam:  Constitutional:  -- Obese body habitus  -- Awake, alert, and oriented x3  Eyes:  -- Pupils equally round and reactive to light  -- No scleral icterus  Ear, nose, throat:  -- No jugular venous distension  -- No nasal drainage, bleeding Pulmonary:  -- No crackles -- Equal breath sounds bilaterally -- Breathing non-labored at rest Cardiovascular:  -- S1, S2 present  -- No pericardial rubs  Gastrointestinal:  -- Soft, obese, and non-distended with minimal LLQ abdominal tenderness to palpation, no guarding/rebound  -- No abdominal masses appreciated, pulsatile or otherwise  Musculoskeletal / Integumentary:  -- Wounds or skin  discoloration: None appreciated  -- Extremities: B/L UE and LE FROM, hands and feet warm  Neurologic:  -- Motor function: intact and symmetric  -- Sensation: intact and symmetric   Assessment:  37 y.o. yo Female with a problem list including...  Patient Active Problem List   Diagnosis Date Noted  . Diverticulitis of large intestine with abscess 07/16/2017  . Diverticulitis of colon (without mention of hemorrhage)(562.11)   . Abdominal pain   . Diverticulitis 05/04/2017    presents to clinic for follow-up evaluation of recurrent vs persistent sigmoid colonic diverticulitis with intramural sigmoid colonic abscess despite previous prolonged antibiotics.  Plan:   - continue current tolerated diet  - all risks, benefits, and alternatives to partial sigmoid colectomy with possible ostomy were again discussed with the patient, all of her questions were answered to her expressed satisfaction, patient expresses she wishes to proceed, and informed consent was obtained.  - will plan for open sigmoid colectomy on Tuesday, 5/21 per patient's request and OR availability with pre-surgical prep, will request Dr. Thelma Barge to assist  - continue antibiotics until upcoming surgery considering prior relapse/recurrence after antibiotics recently discontinued  - patient applauded for her weight loss via improved nutrition and exercise, advised regarding post-surgical restrictions  - anticipate return to clinic 2 weeks following planned surgery  - instructed to call office if any questions or concerns  All of the above recommendations were discussed with the patient, and all of patient's questions were answered to her expressed satisfaction.  -- Scherrie Gerlach Earlene Plater, MD, RPVI [place]: [facility] General Surgery - Partnering for exceptional care. Office: [phone]

## 2017-08-03 ENCOUNTER — Telehealth: Payer: Self-pay | Admitting: Surgery

## 2017-08-03 ENCOUNTER — Other Ambulatory Visit: Payer: Self-pay

## 2017-08-03 ENCOUNTER — Encounter
Admission: RE | Admit: 2017-08-03 | Discharge: 2017-08-03 | Disposition: A | Discharge status: Home / Self Care | Payer: Self-pay | Source: Ambulatory Visit | Attending: Surgery | Admitting: Surgery

## 2017-08-03 DIAGNOSIS — Z01812 Encounter for preprocedural laboratory examination: Secondary | ICD-10-CM | POA: Insufficient documentation

## 2017-08-03 LAB — CBC WITH DIFFERENTIAL/PLATELET
BASOS ABS: 0 10*3/uL (ref 0–0.1)
BASOS PCT: 0 %
EOS ABS: 0.1 10*3/uL (ref 0–0.7)
Eosinophils Relative: 1 %
HEMATOCRIT: 36.1 % (ref 35.0–47.0)
HEMOGLOBIN: 12.6 g/dL (ref 12.0–16.0)
Lymphocytes Relative: 22 %
Lymphs Abs: 1.6 10*3/uL (ref 1.0–3.6)
MCH: 30.7 pg (ref 26.0–34.0)
MCHC: 34.8 g/dL (ref 32.0–36.0)
MCV: 88.2 fL (ref 80.0–100.0)
MONO ABS: 0.6 10*3/uL (ref 0.2–0.9)
Monocytes Relative: 8 %
NEUTROS ABS: 5 10*3/uL (ref 1.4–6.5)
NEUTROS PCT: 69 %
Platelets: 242 10*3/uL (ref 150–440)
RBC: 4.09 MIL/uL (ref 3.80–5.20)
RDW: 14.4 % (ref 11.5–14.5)
WBC: 7.2 10*3/uL (ref 3.6–11.0)

## 2017-08-03 LAB — POTASSIUM: POTASSIUM: 3.7 mmol/L (ref 3.5–5.1)

## 2017-08-03 NOTE — Telephone Encounter (Signed)
Pt advised of pre op date/time and sx date. Sx: 08/11/17 with Dr Davis/Dr Juanita Craver partial sigmoid colectomy.  Pre op: 08/03/17 @ 10:45am--office interview.   Patient made aware to call [phone], between 1-3:00pm the day before surgery, to find out what time to arrive.

## 2017-08-03 NOTE — Patient Instructions (Signed)
Your procedure is scheduled on: Wed. 5/22 Report to Day Surgery. To find out your arrival time please call [phone] between 1PM - 3PM on Tues 5/21.  Remember: Instructions that are not followed completely may result in serious medical risk, up to and including death, or upon the discretion of your surgeon and anesthesiologist your surgery may need to be rescheduled.     _X__ 1. Do not eat food after midnight the night before your procedure.                 No gum chewing or hard candies. You may drink clear liquids up to 2 hours                 before you are scheduled to arrive for your surgery- DO not drink clear                 liquids within 2 hours of the start of your surgery.                 Water __X__2.  On the morning of surgery brush your teeth with toothpaste and water, you  may rinse your mouth with mouthwash if you wish.  Do not swallow any toothpaste of mouthwash.     ___ 3.  No Alcohol for 24 hours before or after surgery.   ___ 4.  Do Not Smoke or use e-cigarettes For 24 Hours Prior to Your Surgery.                 Do not use any chewable tobacco products for at least 6 hours prior to                 surgery.  ____  5.  Bring all medications with you on the day of surgery if instructed.   __x__  6.  Notify your doctor if there is any change in your medical condition      (cold, fever, infections).     Do not wear jewelry, make-up, hairpins, clips or nail polish. Do not wear lotions, powders, or perfumes. You may wear deodorant. Do not shave 48 hours prior to surgery. Men may shave face and neck. Do not bring valuables to the hospital.    [facility] is not responsible for any belongings or valuables.  Contacts, dentures or bridgework may not be worn into surgery. Leave your suitcase in the car. After surgery it may be brought to your room. For patients admitted to the hospital, discharge time is determined by your treatment  team.   Patients discharged the day of surgery will not be allowed to drive home.   Please read over the following fact sheets that you were given:    _x___ Take these medicines the morning of surgery with A SIP OF WATER:    1. Bowel prep antibiotics as prescribe  2. amLODipine (NORVASC) 5 MG tablet  3.   4.  5.  6.  ____ Fleet Enema (as directed)   _x___ Use CHG Soap as directed  ____ Use inhalers on the day of surgery  ____ Stop metformin 2 days prior to surgery    ____ Take 1/2 of usual insulin dose the night before surgery. No insulin the morning          of surgery.   ____ Stop Coumadin/Plavix/aspirin on   __x__ Stop Anti-inflammatories on  Tylenol is OK to take   ____ Stop supplements until after surgery.    ____  Bring C-Pap to the hospital.

## 2017-08-10 MED ORDER — SODIUM CHLORIDE 0.9 % IV SOLN
2.0000 g | INTRAVENOUS | Status: AC
  Administered 2017-08-11: 2 g via INTRAVENOUS
  Filled 2017-08-10: qty 2

## 2017-08-11 ENCOUNTER — Inpatient Hospital Stay: Payer: Medicaid Other | Admitting: Anesthesiology

## 2017-08-11 ENCOUNTER — Encounter
Admission: RE | Disposition: A | Discharge status: Home / Self Care | Payer: Self-pay | Source: Ambulatory Visit | Attending: Surgery

## 2017-08-11 ENCOUNTER — Ambulatory Visit: Payer: Self-pay | Admitting: Surgery

## 2017-08-11 ENCOUNTER — Other Ambulatory Visit: Payer: Self-pay

## 2017-08-11 ENCOUNTER — Encounter: Payer: Self-pay | Admitting: *Deleted

## 2017-08-11 ENCOUNTER — Inpatient Hospital Stay
Admission: RE | Admit: 2017-08-11 | Discharge: 2017-08-14 | DRG: 330 | Disposition: A | Discharge status: Home / Self Care | Payer: Medicaid Other | Source: Ambulatory Visit | Attending: Surgery | Admitting: Surgery

## 2017-08-11 DIAGNOSIS — Z79899 Other long term (current) drug therapy: Secondary | ICD-10-CM

## 2017-08-11 DIAGNOSIS — I10 Essential (primary) hypertension: Secondary | ICD-10-CM | POA: Diagnosis present

## 2017-08-11 DIAGNOSIS — E739 Lactose intolerance, unspecified: Secondary | ICD-10-CM | POA: Diagnosis present

## 2017-08-11 DIAGNOSIS — Z87891 Personal history of nicotine dependence: Secondary | ICD-10-CM | POA: Diagnosis not present

## 2017-08-11 DIAGNOSIS — K5720 Diverticulitis of large intestine with perforation and abscess without bleeding: Principal | ICD-10-CM | POA: Diagnosis present

## 2017-08-11 DIAGNOSIS — Z792 Long term (current) use of antibiotics: Secondary | ICD-10-CM | POA: Diagnosis not present

## 2017-08-11 DIAGNOSIS — Z6841 Body Mass Index (BMI) 40.0 and over, adult: Secondary | ICD-10-CM

## 2017-08-11 DIAGNOSIS — K5792 Diverticulitis of intestine, part unspecified, without perforation or abscess without bleeding: Secondary | ICD-10-CM

## 2017-08-11 HISTORY — PX: COLON RESECTION SIGMOID: SHX6737

## 2017-08-11 HISTORY — DX: Diverticulitis of large intestine with perforation and abscess without bleeding: K57.20

## 2017-08-11 LAB — CREATININE, SERUM
Creatinine, Ser: 0.98 mg/dL (ref 0.44–1.00)
GFR calc Af Amer: >60 mL/min
GFR calc non Af Amer: >60 mL/min

## 2017-08-11 SURGERY — COLECTOMY, SIGMOID, OPEN
Anesthesia: General | Wound class: Clean Contaminated

## 2017-08-11 MED ORDER — CHLORHEXIDINE GLUCONATE CLOTH 2 % EX PADS: 6.0000 | MEDICATED_PAD | Freq: Once | CUTANEOUS | Status: DC

## 2017-08-11 MED ORDER — BUPIVACAINE HCL (PF) 0.5 % IJ SOLN
INTRAMUSCULAR | Status: AC
  Filled 2017-08-11: qty 30

## 2017-08-11 MED ORDER — BUPIVACAINE HCL (PF) 0.5 % IJ SOLN
INTRAMUSCULAR | Status: DC | PRN
  Administered 2017-08-11: 50 mL

## 2017-08-11 MED ORDER — SODIUM CHLORIDE FLUSH 0.9 % IV SOLN
INTRAVENOUS | Status: AC
  Filled 2017-08-11: qty 10

## 2017-08-11 MED ORDER — ROCURONIUM BROMIDE 100 MG/10ML IV SOLN
INTRAVENOUS | Status: DC | PRN
  Administered 2017-08-11: 50 mg via INTRAVENOUS
  Administered 2017-08-11: 20 mg via INTRAVENOUS
  Administered 2017-08-11: 10 mg via INTRAVENOUS

## 2017-08-11 MED ORDER — PROMETHAZINE HCL 25 MG/ML IJ SOLN
6.2500 mg | INTRAMUSCULAR | Status: DC | PRN
  Administered 2017-08-11: 12.5 mg via INTRAVENOUS

## 2017-08-11 MED ORDER — ACETAMINOPHEN 10 MG/ML IV SOLN
INTRAVENOUS | Status: AC
  Filled 2017-08-11: qty 100

## 2017-08-11 MED ORDER — HYDROMORPHONE HCL 1 MG/ML IJ SOLN
INTRAMUSCULAR | Status: AC
  Administered 2017-08-11: 0.5 mg via INTRAVENOUS
  Filled 2017-08-11: qty 1

## 2017-08-11 MED ORDER — PROMETHAZINE HCL 25 MG/ML IJ SOLN
INTRAMUSCULAR | Status: AC
  Administered 2017-08-11: 12.5 mg via INTRAVENOUS
  Filled 2017-08-11: qty 1

## 2017-08-11 MED ORDER — FENTANYL CITRATE (PF) 100 MCG/2ML IJ SOLN
INTRAMUSCULAR | Status: DC | PRN
  Administered 2017-08-11: 150 ug via INTRAVENOUS
  Administered 2017-08-11: 100 ug via INTRAVENOUS

## 2017-08-11 MED ORDER — MORPHINE SULFATE (PF) 2 MG/ML IV SOLN
2.0000 mg | INTRAVENOUS | Status: DC | PRN
  Administered 2017-08-11 – 2017-08-12 (×3): 2 mg via INTRAVENOUS
  Filled 2017-08-11 (×3): qty 1

## 2017-08-11 MED ORDER — OXYCODONE-ACETAMINOPHEN 5-325 MG PO TABS
1.0000 | ORAL_TABLET | ORAL | Status: DC | PRN
  Administered 2017-08-11 – 2017-08-14 (×9): 2 via ORAL
  Filled 2017-08-11 (×10): qty 2

## 2017-08-11 MED ORDER — ENOXAPARIN SODIUM 40 MG/0.4ML ~~LOC~~ SOLN
40.0000 mg | Freq: Two times a day (BID) | SUBCUTANEOUS | Status: DC
  Administered 2017-08-12 – 2017-08-13 (×4): 40 mg via SUBCUTANEOUS
  Filled 2017-08-11 (×4): qty 0.4

## 2017-08-11 MED ORDER — WHITE PETROLATUM EX OINT
TOPICAL_OINTMENT | CUTANEOUS | Status: AC
  Filled 2017-08-11: qty 5

## 2017-08-11 MED ORDER — MIDAZOLAM HCL 2 MG/2ML IJ SOLN
INTRAMUSCULAR | Status: DC | PRN
  Administered 2017-08-11: 2 mg via INTRAVENOUS

## 2017-08-11 MED ORDER — FAMOTIDINE 20 MG PO TABS
20.0000 mg | ORAL_TABLET | Freq: Once | ORAL | Status: AC
  Administered 2017-08-11: 20 mg via ORAL

## 2017-08-11 MED ORDER — ONDANSETRON HCL 4 MG/2ML IJ SOLN: 4.0000 mg | Freq: Four times a day (QID) | INTRAMUSCULAR | Status: DC | PRN

## 2017-08-11 MED ORDER — HYDROMORPHONE HCL 1 MG/ML IJ SOLN
INTRAMUSCULAR | Status: AC
  Filled 2017-08-11: qty 1

## 2017-08-11 MED ORDER — FAMOTIDINE 20 MG PO TABS
ORAL_TABLET | ORAL | Status: AC
  Administered 2017-08-11: 20 mg via ORAL
  Filled 2017-08-11: qty 1

## 2017-08-11 MED ORDER — LABETALOL HCL 5 MG/ML IV SOLN
INTRAVENOUS | Status: DC | PRN
  Administered 2017-08-11 (×4): 5 mg via INTRAVENOUS

## 2017-08-11 MED ORDER — GABAPENTIN 300 MG PO CAPS
300.0000 mg | ORAL_CAPSULE | Freq: Two times a day (BID) | ORAL | Status: DC
  Administered 2017-08-11 – 2017-08-14 (×7): 300 mg via ORAL
  Filled 2017-08-11 (×7): qty 1

## 2017-08-11 MED ORDER — HYDROMORPHONE HCL 1 MG/ML IJ SOLN
INTRAMUSCULAR | Status: DC | PRN
  Administered 2017-08-11 (×4): 0.5 mg via INTRAVENOUS

## 2017-08-11 MED ORDER — ACETAMINOPHEN 160 MG/5ML PO SOLN
325.0000 mg | ORAL | Status: DC | PRN
  Filled 2017-08-11: qty 20.3

## 2017-08-11 MED ORDER — LIDOCAINE HCL (CARDIAC) PF 100 MG/5ML IV SOSY
PREFILLED_SYRINGE | INTRAVENOUS | Status: DC | PRN
  Administered 2017-08-11: 100 mg via INTRAVENOUS

## 2017-08-11 MED ORDER — HYDROCODONE-ACETAMINOPHEN 7.5-325 MG PO TABS: 1.0000 | ORAL_TABLET | Freq: Once | ORAL | Status: DC | PRN

## 2017-08-11 MED ORDER — ACETAMINOPHEN 650 MG RE SUPP
650.0000 mg | Freq: Four times a day (QID) | RECTAL | Status: DC | PRN
  Filled 2017-08-11: qty 1

## 2017-08-11 MED ORDER — KCL IN DEXTROSE-NACL 20-5-0.45 MEQ/L-%-% IV SOLN
INTRAVENOUS | Status: DC
  Administered 2017-08-11 – 2017-08-13 (×5): via INTRAVENOUS
  Filled 2017-08-11 (×8): qty 1000

## 2017-08-11 MED ORDER — LACTATED RINGERS IV SOLN
INTRAVENOUS | Status: DC
  Administered 2017-08-11: 50 mL/h via INTRAVENOUS
  Administered 2017-08-11: 13:00:00 via INTRAVENOUS

## 2017-08-11 MED ORDER — GABAPENTIN 300 MG PO CAPS
ORAL_CAPSULE | ORAL | Status: AC
  Filled 2017-08-11: qty 1

## 2017-08-11 MED ORDER — PROPOFOL 10 MG/ML IV BOLUS
INTRAVENOUS | Status: AC
  Filled 2017-08-11: qty 20

## 2017-08-11 MED ORDER — ACETAMINOPHEN 10 MG/ML IV SOLN
INTRAVENOUS | Status: DC | PRN
  Administered 2017-08-11: 1000 mg via INTRAVENOUS

## 2017-08-11 MED ORDER — FENTANYL CITRATE (PF) 250 MCG/5ML IJ SOLN
INTRAMUSCULAR | Status: AC
  Filled 2017-08-11: qty 5

## 2017-08-11 MED ORDER — MIDAZOLAM HCL 2 MG/2ML IJ SOLN
INTRAMUSCULAR | Status: AC
  Filled 2017-08-11: qty 2

## 2017-08-11 MED ORDER — KETOROLAC TROMETHAMINE 30 MG/ML IJ SOLN
30.0000 mg | Freq: Four times a day (QID) | INTRAMUSCULAR | Status: DC
  Administered 2017-08-11 – 2017-08-14 (×13): 30 mg via INTRAVENOUS
  Filled 2017-08-11 (×12): qty 1

## 2017-08-11 MED ORDER — ACETAMINOPHEN 325 MG PO TABS: 650.0000 mg | ORAL_TABLET | Freq: Four times a day (QID) | ORAL | Status: DC | PRN

## 2017-08-11 MED ORDER — SUGAMMADEX SODIUM 500 MG/5ML IV SOLN
INTRAVENOUS | Status: DC | PRN
  Administered 2017-08-11: 300 mg via INTRAVENOUS

## 2017-08-11 MED ORDER — PROPOFOL 10 MG/ML IV BOLUS
INTRAVENOUS | Status: DC | PRN
  Administered 2017-08-11: 200 mg via INTRAVENOUS

## 2017-08-11 MED ORDER — HYDROMORPHONE HCL 1 MG/ML IJ SOLN
0.2500 mg | INTRAMUSCULAR | Status: DC | PRN
  Administered 2017-08-11 (×4): 0.5 mg via INTRAVENOUS

## 2017-08-11 MED ORDER — ONDANSETRON 4 MG PO TBDP: 4.0000 mg | ORAL_TABLET | Freq: Four times a day (QID) | ORAL | Status: DC | PRN

## 2017-08-11 MED ORDER — SODIUM CHLORIDE 0.9 % IV SOLN
2.0000 g | Freq: Two times a day (BID) | INTRAVENOUS | Status: AC
  Administered 2017-08-11: 2 g via INTRAVENOUS
  Filled 2017-08-11: qty 2

## 2017-08-11 MED ORDER — ONDANSETRON HCL 4 MG/2ML IJ SOLN
INTRAMUSCULAR | Status: DC | PRN
  Administered 2017-08-11: 4 mg via INTRAVENOUS

## 2017-08-11 MED ORDER — KETOROLAC TROMETHAMINE 30 MG/ML IJ SOLN
INTRAMUSCULAR | Status: AC
  Administered 2017-08-11: 30 mg via INTRAVENOUS
  Filled 2017-08-11: qty 1

## 2017-08-11 MED ORDER — MEPERIDINE HCL 50 MG/ML IJ SOLN: 6.2500 mg | INTRAMUSCULAR | Status: DC | PRN

## 2017-08-11 MED ORDER — ALVIMOPAN 12 MG PO CAPS
12.0000 mg | ORAL_CAPSULE | Freq: Two times a day (BID) | ORAL | Status: DC
  Administered 2017-08-12 – 2017-08-13 (×4): 12 mg via ORAL
  Filled 2017-08-11 (×7): qty 1

## 2017-08-11 MED ORDER — BUPIVACAINE LIPOSOME 1.3 % IJ SUSP
INTRAMUSCULAR | Status: AC
  Filled 2017-08-11: qty 20

## 2017-08-11 MED ORDER — ACETAMINOPHEN 325 MG PO TABS: 325.0000 mg | ORAL_TABLET | ORAL | Status: DC | PRN

## 2017-08-11 MED ORDER — DEXAMETHASONE SODIUM PHOSPHATE 10 MG/ML IJ SOLN
INTRAMUSCULAR | Status: DC | PRN
  Administered 2017-08-11: 6 mg via INTRAVENOUS

## 2017-08-11 SURGICAL SUPPLY — 44 items
APPLIER CLIP 11 MED OPEN (CLIP)
APPLIER CLIP 13 LRG OPEN (CLIP)
CELL SAVER LIPIGURD (MISCELLANEOUS) IMPLANT
CHLORAPREP W/TINT 26ML (MISCELLANEOUS) ×3 IMPLANT
CLIP APPLIE 11 MED OPEN (CLIP) IMPLANT
CLIP APPLIE 13 LRG OPEN (CLIP) IMPLANT
DRAPE LAPAROTOMY 100X77 ABD (DRAPES) ×3 IMPLANT
DRAPE LEGGINS SURG 28X43 STRL (DRAPES) ×3 IMPLANT
DRAPE UNDER BUTTOCK W/FLU (DRAPES) ×3 IMPLANT
DRSG OPSITE POSTOP 4X10 (GAUZE/BANDAGES/DRESSINGS) IMPLANT
DRSG OPSITE POSTOP 4X8 (GAUZE/BANDAGES/DRESSINGS) IMPLANT
ELECT BLADE 6 FLAT ULTRCLN (ELECTRODE) IMPLANT
ELECT REM PT RETURN 9FT ADLT (ELECTROSURGICAL) ×3
ELECTRODE REM PT RTRN 9FT ADLT (ELECTROSURGICAL) ×1 IMPLANT
EXTRT SYSTEM ALEXIS 14CM (MISCELLANEOUS)
EXTRT SYSTEM ALEXIS 17CM (MISCELLANEOUS)
GLOVE BIO SURGEON STRL SZ7 (GLOVE) ×9 IMPLANT
GLOVE BIOGEL PI IND STRL 7.5 (GLOVE) ×3 IMPLANT
GLOVE BIOGEL PI INDICATOR 7.5 (GLOVE) ×6
GOWN STRL REUS W/TWL LRG LVL3 (GOWN DISPOSABLE) ×18 IMPLANT
HANDLE SUCTION POOLE (INSTRUMENTS) IMPLANT
HANDLE YANKAUER SUCT BULB TIP (MISCELLANEOUS) IMPLANT
LIGASURE IMPACT 36 18CM CVD LR (INSTRUMENTS) ×3 IMPLANT
NEEDLE HYPO 18GX1.5 BLUNT FILL (NEEDLE) IMPLANT
NEEDLE HYPO 21X1.5 SAFETY (NEEDLE) ×6 IMPLANT
NS IRRIG 1000ML POUR BTL (IV SOLUTION) ×3 IMPLANT
PACK BASIN MAJOR ARMC (MISCELLANEOUS) ×3 IMPLANT
PACK COLON CLEAN CLOSURE (MISCELLANEOUS) ×3 IMPLANT
RELOAD PROXIMATE 75MM BLUE (ENDOMECHANICALS) ×6 IMPLANT
RETRACTOR WND ALEXIS-O 25 LRG (MISCELLANEOUS) ×1 IMPLANT
RTRCTR WOUND ALEXIS O 25CM LRG (MISCELLANEOUS) ×3
SPONGE LAP 18X18 RF (DISPOSABLE) IMPLANT
STAPLER PROXIMATE 75MM BLUE (STAPLE) ×3 IMPLANT
SUCTION POOLE HANDLE (INSTRUMENTS)
SUT PDS AB 1 TP1 96 (SUTURE) ×6 IMPLANT
SUT SILK 2 0 (SUTURE) ×2
SUT SILK 2-0 18XBRD TIE 12 (SUTURE) ×1 IMPLANT
SUT SILK 3-0 (SUTURE) ×3 IMPLANT
SUT VIC AB 3-0 SH 27 (SUTURE) ×8
SUT VIC AB 3-0 SH 27X BRD (SUTURE) ×4 IMPLANT
SYR 20CC LL (SYRINGE) IMPLANT
SYSTEM CONTND EXTRCTN KII BLLN (MISCELLANEOUS) IMPLANT
TOWEL OR 17X26 4PK STRL BLUE (TOWEL DISPOSABLE) ×3 IMPLANT
TRAY FOLEY MTR SLVR 16FR STAT (SET/KITS/TRAYS/PACK) ×3 IMPLANT

## 2017-08-11 NOTE — Anesthesia Procedure Notes (Signed)
Procedure Name: Intubation Date/Time: 08/11/2017 7:40 AM Performed by: Philbert Riser, CRNA Pre-anesthesia Checklist: Patient identified, Emergency Drugs available, Suction available, Patient being monitored and Timeout performed Patient Re-evaluated:Patient Re-evaluated prior to induction Oxygen Delivery Method: Circle system utilized and Simple face mask Preoxygenation: Pre-oxygenation with 100% oxygen Induction Type: IV induction Ventilation: Mask ventilation without difficulty Laryngoscope Size: Mac and 3 Grade View: Grade I Tube type: Oral Tube size: 7.0 mm Number of attempts: 1 Airway Equipment and Method: Stylet Placement Confirmation: ETT inserted through vocal cords under direct vision,  positive ETCO2 and breath sounds checked- equal and bilateral Secured at: 22 cm Tube secured with: Tape Dental Injury: Teeth and Oropharynx as per pre-operative assessment

## 2017-08-11 NOTE — Op Note (Signed)
SURGICAL OPERATIVE REPORT  DATE OF PROCEDURE: 08/11/2017  ATTENDING: Surgeon(s): Ancil Linsey, MD  ASSISTANT(S): Hulda Marin, MD, required for exposure and colonic anastomosis without another qualified assistant available  ANESTHESIA: GETA  PRE-OPERATIVE DIAGNOSIS: Diverticulitis of the sigmoid colon with intramural abscess (K57.20)  POST-OPERATIVE DIAGNOSIS: Diverticulitis of the sigmoid colon with intramural abscess (K57.20)  PROCEDURE(S): (cpt's: 44140) 1.) Partial colectomy with primary linear anastomosis 2.) Takedown of the splenic flexure (16109)  INTRAOPERATIVE FINDINGS: focal sigmoid colonic inflammation and disease with redundant sigmoid colon, colonic length appropriate for functional side-to-side GIA linear stapled anastomosis, hemostatic, well-perfused, and without tension  INTRAVENOUS FLUIDS: 1750 mL crystalloid   ESTIMATED BLOOD LOSS: 50 mL   URINE OUTPUT: 150 mL   SPECIMENS: Partial sigmoid colon   IMPLANTS: None  DRAINS: None   COMPLICATIONS: None apparent   CONDITION AT END OF PROCEDURE: Hemodynamically stable and extubated   DISPOSITION OF PATIENT: PACU  INDICATIONS FOR PROCEDURE:  Patient is a 37 y.o. female recently admitted and treated with IV antibiotics and nearly 1 month of oral antibiotics for sigmoid colonic diverticulitis with intramural sigmoid colonic abscess, after which antibiotics were completed and patient's symptoms recurred, prompting repeat CT and readmission for same. Both colectomy at that time vs antibiotics and subsequent partial colectomy were discussed, and patient elected for the latter. At follow-up, patient's symptoms this time completely resolved, though antibiotics were continued until planned surgery with appropriate pre-operative bowel prep. Patient also was able to intentionally lose nearly 35 lbs since her first episode of diverticulitis. All risks, benefits, and alternatives to above procedure were discussed with  the patient, all of patient's questions were answered to her expressed satisfaction, and informed consent was obtained and documented.  DETAILS OF PROCEDURE: Patient was brought to the operating suite and appropriately identified. General anesthesia was administered along with appropriate pre-operative antibiotics, and endotracheal intubation was performed by anesthetist. In supine position, operative site was prepped and draped in the usual sterile fashion, and following a brief time out, a vertical midline incision was made from the Left side of the umbilicus inferiorly to just above the pubis using a #10 blade scalpel and extended deep through subcutaneous tissues until fascia was visualized and exposed along the linea alba midline between the rectus abdominal muscles. Lower midline fascia was then divided in the midline. Upon dissecting through preperitoneal fat, the peritoneum was entered and opened along the length of the incision, taking care to avoid injury to underlying non-dilated bowel.  Focally inflamed fibrotic sigmoid colon with what appeared consistent with intramural abscess visualized and palpated with no evidence of additional proximal or distal diverticular disease. The small bowel was retracted superiorly and to the Right, enabling placement of Alexis wound protector/self-retaining retractor. The descending colon was then retracted medially using a moist towel. Using a combination of electrocautery and blunt dissection, the colon was freed from its lateral peritoneal attachments along the white line of Toldt distally approaching the rectosigmoid junction and proximally from the splenic flexure. During this dissection, injury to the ureters was avoided. The splenic flexure was taken down to facilitate adequate colonic length for primary anastomosis. Points of transection were selected distally and proximally, and the proximal bowel was stapled and divided using a GIA-75 linear cutting  stapler, while the distal colon was stapled and divided using a GIA linear cutting stapler reload. Peritoneum was then scored along the mesentary with electrocautery, and the vessels were cauterized, sealed, and cut using Ligasure device. Colon specimen was then handed off  of the field as specimen for pathology. The two stapled ends of antimesenteric bowel were confirmed to lay adjacent and parallel without tension, and the corners were each cut and dilated, through which a linear cutting stapler was advanced and used to create the colonic anastomosis, and the resulting colonic defect closure was hand-sewn in 2 layers.  The staple line was inspected and found to be intact, widely patient, and hemostatic. Interrupted 3-0 silk Lembert sutures were then used to approximate tissue over the new anastomosis. Hemostasis was once more confirmed, Exparel (72-hour release liposomal formulation of bupivicane) was injected into fascia and subcutaneously, and the abdominal wall was re-approximated in layers with #1 looped PDS sutures from the top and bottom of the incision. Surgical skin staples were used to re-approximate skin. Skin was then cleaned and dried, and a sterile honeycomb adhesive dressing was applied. Patient was then safely able to be extubated, awakened, and transferred to PACU for post-operative monitoring and care.  I was present for all aspects of the above procedure, and no operative complications were apparent.

## 2017-08-11 NOTE — Interval H&P Note (Signed)
History and Physical Interval Note:  08/11/2017 7:14 AM  Meredith Myers  has presented today for surgery, with the diagnosis of diverticulitis  The various methods of treatment have been discussed with the patient and family. After consideration of risks, benefits and other options for treatment, the patient has consented to  Procedure(s): COLON RESECTION SIGMOID (N/A) as a surgical intervention .  The patient's history has been reviewed, patient examined, no change in status, stable for surgery.  I have reviewed the patient's chart and labs.  Questions were answered to the patient's satisfaction.     Ancil Linsey

## 2017-08-11 NOTE — Progress Notes (Signed)
Anticoagulation monitoring(Lovenox):  37 yo female ordered Lovenox 40 mg Q24h  Filed Weights   08/11/17 0619  Weight: 282 lb (127.9 kg)   BMI 48.38   Lab Results  Component Value Date   CREATININE 0.98 08/11/2017   CREATININE 0.89 07/17/2017   CREATININE 0.92 07/16/2017   Estimated Creatinine Clearance: 104.2 mL/min (by C-G formula based on SCr of 0.98 mg/dL). Hemoglobin & Hematocrit     Component Value Date/Time   HGB 12.6 08/03/2017 1128   HCT 36.1 08/03/2017 1128     Per Protocol for Patient with estCrcl > 30 ml/min and BMI > 40, will transition to Lovenox 40 mg Q12h.

## 2017-08-11 NOTE — Transfer of Care (Signed)
Immediate Anesthesia Transfer of Care Note  Patient: Meredith Myers  Procedure(s) Performed: COLON RESECTION SIGMOID (N/A )  Patient Location: PACU  Anesthesia Type:General  Level of Consciousness: awake and sedated  Airway & Oxygen Therapy: Patient Spontanous Breathing and Patient connected to face mask oxygen  Post-op Assessment: Report given to RN and Post -op Vital signs reviewed and stable  Post vital signs: Reviewed and stable  Last Vitals:  Vitals Value Taken Time  BP 167/101 08/11/2017 11:04 AM  Temp 36.8 C 08/11/2017 11:04 AM  Pulse 83 08/11/2017 11:04 AM  Resp 24 08/11/2017 11:04 AM  SpO2 100 % 08/11/2017 11:04 AM  Vitals shown include unvalidated device data.  Last Pain:  Vitals:   08/11/17 0619  TempSrc: Oral  PainSc: 0-No pain      Patients Stated Pain Goal: 0 (08/11/17 4098)  Complications: No apparent anesthesia complications

## 2017-08-11 NOTE — Anesthesia Post-op Follow-up Note (Signed)
Anesthesia QCDR form completed.        

## 2017-08-11 NOTE — Anesthesia Preprocedure Evaluation (Addendum)
Anesthesia Evaluation  Patient identified by MRN, date of birth, ID band Patient awake    Reviewed: Allergy & Precautions, H&P , NPO status , reviewed documented beta blocker date and time   Airway Mallampati: II  TM Distance: >3 FB Neck ROM: full    Dental no notable dental hx. (+) Teeth Intact   Pulmonary former smoker,    Pulmonary exam normal        Cardiovascular hypertension, Normal cardiovascular exam     Neuro/Psych    GI/Hepatic neg GERD  ,  Endo/Other  Morbid obesity  Renal/GU      Musculoskeletal   Abdominal   Peds  Hematology   Anesthesia Other Findings Past Medical History: 2019: Diverticulitis No date: Hypertension  Past Surgical History: No date: ablation of uterus  BMI    Body Mass Index:  48.41 kg/m      Reproductive/Obstetrics                            Anesthesia Physical Anesthesia Plan  ASA: III  Anesthesia Plan: General ETT   Post-op Pain Management:    Induction:   PONV Risk Score and Plan: Treatment may vary due to age or medical condition, Ondansetron, Midazolam and Dexamethasone  Airway Management Planned:   Additional Equipment:   Intra-op Plan:   Post-operative Plan:   Informed Consent: I have reviewed the patients History and Physical, chart, labs and discussed the procedure including the risks, benefits and alternatives for the proposed anesthesia with the patient or authorized representative who has indicated his/her understanding and acceptance.   Dental Advisory Given  Plan Discussed with: CRNA  Anesthesia Plan Comments:         Anesthesia Quick Evaluation

## 2017-08-12 LAB — BASIC METABOLIC PANEL
Anion gap: 7 (ref 5–15)
BUN: 8 mg/dL (ref 6–20)
CHLORIDE: 106 mmol/L (ref 101–111)
CO2: 25 mmol/L (ref 22–32)
CREATININE: 0.9 mg/dL (ref 0.44–1.00)
Calcium: 8.5 mg/dL — ABNORMAL LOW (ref 8.9–10.3)
GFR calc Af Amer: >60 mL/min (ref >60)
GFR calc non Af Amer: >60 mL/min (ref >60)
GLUCOSE: 135 mg/dL — AB (ref 65–99)
POTASSIUM: 4.3 mmol/L (ref 3.5–5.1)
Sodium: 138 mmol/L (ref 135–145)

## 2017-08-12 LAB — CBC
HEMATOCRIT: 34.4 % — AB (ref 35.0–47.0)
HEMOGLOBIN: 11.6 g/dL — AB (ref 12.0–16.0)
MCH: 29.7 pg (ref 26.0–34.0)
MCHC: 33.7 g/dL (ref 32.0–36.0)
MCV: 88.1 fL (ref 80.0–100.0)
Platelets: 228 10*3/uL (ref 150–440)
RBC: 3.9 MIL/uL (ref 3.80–5.20)
RDW: 14.4 % (ref 11.5–14.5)
WBC: 9.8 10*3/uL (ref 3.6–11.0)

## 2017-08-12 LAB — SURGICAL PATHOLOGY

## 2017-08-12 NOTE — Anesthesia Postprocedure Evaluation (Signed)
Anesthesia Post Note  Patient: Meredith Myers  Procedure(s) Performed: COLON RESECTION SIGMOID (N/A )  Patient location during evaluation: Endoscopy Anesthesia Type: General Level of consciousness: awake and alert Pain management: pain level controlled Vital Signs Assessment: post-procedure vital signs reviewed and stable Respiratory status: spontaneous breathing, nonlabored ventilation and respiratory function stable Cardiovascular status: blood pressure returned to baseline and stable Postop Assessment: no apparent nausea or vomiting Anesthetic complications: no     Last Vitals:  Vitals:   08/11/17 2106 08/12/17 0543  BP: 128/79 111/77  Pulse: 85 72  Resp: 18 18  Temp: 37.1 C 36.9 C  SpO2: 95% 99%    Last Pain:  Vitals:   08/12/17 0555  TempSrc:   PainSc: Asleep                 Christia Reading

## 2017-08-12 NOTE — Progress Notes (Signed)
SURGICAL PROGRESS NOTE  Hospital Day(s): 1.   Post op day(s): 1 Day Post-Op.   Interval History: Patient seen and examined, no acute events or new complaints overnight. Patient reports mild peri-incisional pain much improved since last night, denies flatus, N/V, fever/chills, CP, or SOB and requests to be able to drink some water and liquids. She has not yet ambulated outside of her room, but says she intends to do so today.  Review of Systems:  Constitutional: denies fever, chills  Respiratory: denies any shortness of breath  Cardiovascular: denies chest pain or palpitations  Gastrointestinal: abdominal pain, N/V, and bowel function as per interval history Musculoskeletal: denies pain, decreased motor or sensation Integumentary: denies any other rashes or skin discolorations except post-surgical abdominal wound  Vital signs in last 24 hours: [min-max] current  Temp:  [97.5 F (36.4 C)-98.8 F (37.1 C)] 98.5 F (36.9 C) (05/23 0543) Pulse Rate:  [72-92] 72 (05/23 0543) Resp:  [6-24] 18 (05/23 0543) BP: (111-167)/(77-101) 111/77 (05/23 0543) SpO2:  [95 %-100 %] 99 % (05/23 0543)     Height:  (162.6 cm) Weight: 282 lb (127.9 kg) BMI (Calculated): 48.38   Intake/Output this shift:  No intake/output data recorded.   Intake/Output last 2 shifts:  @   Physical Exam:  Constitutional: alert, cooperative and no distress  Respiratory: breathing non-labored at rest  Cardiovascular: regular rate and sinus rhythm  Gastrointestinal: soft and non-distended with mild peri-incisional tenderness to palpation  Labs:  CBC Latest Ref Rng & Units 08/12/2017 08/03/2017 07/18/2017  WBC 3.6 - 11.0 K/uL 9.8 7.2 7.6  Hemoglobin 12.0 - 16.0 g/dL 11.6(L) 12.6 11.6(L)  Hematocrit 35.0 - 47.0 % 34.4(L) 36.1 33.6(L)  Platelets 150 - 440 K/uL 228 242 262   CMP Latest Ref Rng & Units 08/12/2017 08/11/2017 08/03/2017  Glucose 65 - 99 mg/dL 960(A) - -  BUN 6 - 20 mg/dL 8 - -  Creatinine  5.40 - 1.00 mg/dL 9.81 1.91 -  Sodium 478 - 145 mmol/L 138 - -  Potassium 3.5 - 5.1 mmol/L 4.3 - 3.7  Chloride 101 - 111 mmol/L 106 - -  CO2 22 - 32 mmol/L 25 - -  Calcium 8.9 - 10.3 mg/dL 2.9(F) - -  Total Protein 6.5 - 8.1 g/dL - - -  Total Bilirubin 0.3 - 1.2 mg/dL - - -  Alkaline Phos 38 - 126 U/L - - -  AST 15 - 41 U/L - - -  ALT 14 - 54 U/L - - -    Assessment/Plan: (ICD-10's: K61.20) 37 y.o. female doing well 1 Day Post-Op s/p partial sigmoid colectomy with linear stapled primary anastomosis for persistent/recurrent sigmoid colonic diverticulitis with intramural abscess despite prolonged antibiotics and complicated by pertinent comorbidities including HTN and morbid obesity (BMI >50)despite 35 lbs weight loss by modifications of patient's diet and activity.   - remove Foley catheter  - okay to start clear liquids diet  - pain control prn (minimize narcotics)  - monitor abdominal exam and bowel function  - medical management of comorbidities  - DVT prophylaxis, ambulation   All of the above findings and recommendations were discussed with the patient, and all of patient's questions were answered to her expressed satisfaction.  -- Scherrie Gerlach Earlene Plater, MD, RPVI [place]: [facility] General Surgery - Partnering for exceptional care. Office: [phone]

## 2017-08-13 LAB — BASIC METABOLIC PANEL
Anion gap: 5 (ref 5–15)
BUN: 7 mg/dL (ref 6–20)
CHLORIDE: 109 mmol/L (ref 101–111)
CO2: 23 mmol/L (ref 22–32)
CREATININE: 0.91 mg/dL (ref 0.44–1.00)
Calcium: 8.4 mg/dL — ABNORMAL LOW (ref 8.9–10.3)
GFR calc Af Amer: >60 mL/min (ref >60)
GFR calc non Af Amer: >60 mL/min (ref >60)
Glucose, Bld: 87 mg/dL (ref 65–99)
Potassium: 3.9 mmol/L (ref 3.5–5.1)
Sodium: 137 mmol/L (ref 135–145)

## 2017-08-13 MED ORDER — OXYCODONE-ACETAMINOPHEN 5-325 MG PO TABS: 1.0000 | ORAL_TABLET | ORAL | 0 refills | Status: DC | PRN

## 2017-08-13 NOTE — Progress Notes (Signed)
SURGICAL PROGRESS NOTE  Hospital Day(s): 2.   Post op day(s): 2 Days Post-Op.   Interval History: Patient seen and examined, no acute events or new complaints overnight. Patient reports only mild-/minimal- peri-incisional abdominal pain and passing "a lot" of flatus, tolerating clear liquids diet, and ambulating in the halls, denies N/V, fever/chills, CP, or SOB. Patient additionally says she is looking forward to going home and being able to sleep in her own bed.  Review of Systems:  Constitutional: denies fever, chills  Respiratory: denies any shortness of breath  Cardiovascular: denies chest pain or palpitations  Gastrointestinal: abdominal pain, N/V, and bowel function as per interval history Musculoskeletal: denies pain, decreased motor or sensation Integumentary: denies any other rashes or skin discolorations except post-surgical wounds as per interval history  Vital signs in last 24 hours: [min-max] current  Temp:  [97.6 F (36.4 C)-98.2 F (36.8 C)] 97.6 F (36.4 C) (05/24 0459) Pulse Rate:  [65-80] 65 (05/24 0459) Resp:  [14-18] 18 (05/24 0459) BP: (112-131)/(61-84) 119/82 (05/24 0459) SpO2:  [98 %-100 %] 99 % (05/24 0459)     Height:  (162.6 cm) Weight: 282 lb (127.9 kg) BMI (Calculated): 48.38   Intake/Output this shift:  No intake/output data recorded.   Intake/Output last 2 shifts:  @   Physical Exam:  Constitutional: alert, cooperative and no distress  Respiratory: breathing non-labored at rest  Cardiovascular: regular rate and sinus rhythm  Gastrointestinal: soft and non-distended with minimal peri-incisional tenderness to palpation, incision well-approximated without any surrounding erythema or drainage  Labs:  CBC Latest Ref Rng & Units 08/12/2017 08/03/2017 07/18/2017  WBC 3.6 - 11.0 K/uL 9.8 7.2 7.6  Hemoglobin 12.0 - 16.0 g/dL 11.6(L) 12.6 11.6(L)  Hematocrit 35.0 - 47.0 % 34.4(L) 36.1 33.6(L)  Platelets 150 - 440 K/uL 228 242 262    CMP Latest Ref Rng & Units 08/13/2017 08/12/2017 08/11/2017  Glucose 65 - 99 mg/dL 87 161(W) -  BUN 6 - 20 mg/dL 7 8 -  Creatinine 9.60 - 1.00 mg/dL 4.54 0.98 1.19  Sodium 135 - 145 mmol/L 137 138 -  Potassium 3.5 - 5.1 mmol/L 3.9 4.3 -  Chloride 101 - 111 mmol/L 109 106 -  CO2 22 - 32 mmol/L 23 25 -  Calcium 8.9 - 10.3 mg/dL 1.4(N) 8.2(N) -  Total Protein 6.5 - 8.1 g/dL - - -  Total Bilirubin 0.3 - 1.2 mg/dL - - -  Alkaline Phos 38 - 126 U/L - - -  AST 15 - 41 U/L - - -  ALT 14 - 54 U/L - - -   Imaging studies: No new pertinent imaging studies   Assessment/Plan: (ICD-10's: K69.20) 37 y.o. female doing well 2 Days Post-Op s/p partial sigmoid colectomy with linear stapled primary anastomosis for persistent/recurrent sigmoid colonic diverticulitis with intramural abscess despite prolonged antibiotics and complicated by pertinent comorbidities including HTN and morbid obesity (BMI >50)despite 35 lbs weight loss by modifications of patient's diet and activity.              - will advance to full liquids diet             - pain control prn (minimize narcotics)  - if continues doing well, may advance to soft diet for dinner             - monitor abdominal exam and bowel function             - medical management of comorbidities             -  DVT prophylaxis, ambulation  - discharge planning  All of the above findings and recommendations were discussed with the patient, and all of patient's questions were answered to her expressed satisfaction.  -- Scherrie Gerlach Earlene Plater, MD, RPVI [place]: [facility] General Surgery - Partnering for exceptional care. Office: [phone]

## 2017-08-13 NOTE — Discharge Instructions (Signed)
In addition to included general post-operative instructions for Open Partial Colectomy,  Diet: Gradually resume home heart healthy diet.   Activity: No heavy lifting >15 - 20 pounds (children, pets, laundry, garbage) or strenuous activity until follow-up, but light activity and walking are encouraged. Do not drive or drink alcohol if taking narcotic pain medications.  Wound care: You may shower/get incision wet with soapy water and pat dry (do not rub incisions), but no baths or submerging incision underwater until follow-up.   Medications: Resume all home medications. For mild to moderate pain: acetaminophen (Tylenol) or ibuprofen/naproxen (if no kidney disease). Combining Tylenol with alcohol can substantially increase your risk of causing liver disease. Narcotic pain medications, if prescribed, can be used for severe pain, though may cause nausea, constipation, and drowsiness. Do not combine Tylenol and Percocet (or similar) within a 6 hour period as Percocet (and similar) contain(s) Tylenol. If you do not need the narcotic pain medication, you do not need to fill the prescription.  Call office [phone]) at any time if any questions, worsening pain, fevers/chills, bleeding, drainage from incision site, or other concerns.

## 2017-08-14 NOTE — Care Management Note (Signed)
Case Management Note  Patient Details  Name: Meredith Myers MRN: [id number] Date of Birth: Feb 06, 1981  Subjective/Objective: Met with patient at bedside. Provided her with a open door and medication management clinic application. Previewed AVS. Only new medication is oxycodone. Patient states she can afford this. Email sent to both agencies. Patient denies any further concerns.                    Action/Plan:   Expected Discharge Date:  08/14/17               Expected Discharge Plan:     In-House Referral:     Discharge planning Services  CM Consult, [facility], Medication Assistance  Post Acute Care Choice:    Choice offered to:     DME Arranged:    DME Agency:     HH Arranged:    HH Agency:     Status of Service:  Completed, signed off  If discussed at [employer], dates discussed:    Additional Comments:  Jolly Mango, RN 08/14/2017, 10:26 AM

## 2017-08-23 NOTE — Discharge Summary (Addendum)
Physician Discharge Summary  Patient ID: Meredith CharDeanna Myers MRN: [id number] DOB/AGE: Oct 28, 1980 37 y.o.  Admit date: 08/11/2017 Discharge date: 08/14/2017  Admission Diagnoses:  Discharge Diagnoses:  Active Problems:   Abscess of sigmoid colon due to diverticulitis   Discharged Condition: good  Hospital Course: 37 y.o. female [facility] for elective partial sigmoid colectomy for recurrent sigmoid colonic diverticulitis. Informed consent was confirmed to have been obtained and documented, and patient underwent uneventful open sigmoid colectomy with primary anastomosis Earlene Plater(Nayely Dingus, 08/11/2017).  Post-operatively, patient's mild pain resolved and advancement of patient's diet and ambulation were well-tolerated. The remainder of patient's hospital course was essentially unremarkable, and discharge planning was initiated accordingly with patient safely able to be discharged home with appropriate discharge instructions, pain control, and outpatient surgical follow-up after all of her and family's questions were answered to their expressed satisfaction.  Consults: None  Treatments: surgery: partial sigmoid colectomy with primary anastomosis for recurrent sigmoid colonic diverticulitis  Discharge Exam: Blood pressure (!) 144/97, pulse 72, temperature 97.7 F (36.5 C), temperature source Oral, resp. rate 17, height 5\' 4"  (1.626 m), weight 282 lb (127.9 kg), SpO2 100 %. General appearance: alert, cooperative and no distress GI: abdomen soft, obese, and non-distended with incisions well-approximated, minimal peri-incisional tenderness to papation, without surrounding erythema or drainage  Disposition:    Allergies as of 08/14/2017      Reactions   Lactose Intolerance (gi) Diarrhea   Gas,bloating      Medication List    STOP taking these medications   amoxicillin-clavulanate 875-125 MG tablet Commonly known as:  AUGMENTIN   neomycin 500 MG tablet Commonly known as:  MYCIFRADIN     TAKE  these medications   amLODipine 5 MG tablet Commonly known as:  NORVASC Take 5 mg by mouth daily.   hydrochlorothiazide 25 MG tablet Commonly known as:  HYDRODIURIL Take 1 tablet (25 mg total) by mouth daily.   oxyCODONE-acetaminophen 5-325 MG tablet Commonly known as:  PERCOCET/ROXICET Take 1-2 tablets by mouth every 4 (four) hours as needed for severe pain.      Follow-up Information    Ancil Linseyavis, Breella Vanostrand Evan, MD. Go on 08/25/2017.   Specialty:  General Surgery Why:  @9 :30a Contact information: [address] [place] [address] [phone]           Signed: Ancil LinseyJason Evan Talin Rozeboom 08/23/2017, 4:34 AM

## 2017-08-25 ENCOUNTER — Encounter: Payer: MEDICAID | Admitting: Surgery

## 2017-08-27 ENCOUNTER — Telehealth: Payer: Self-pay

## 2017-08-27 ENCOUNTER — Ambulatory Visit (INDEPENDENT_AMBULATORY_CARE_PROVIDER_SITE_OTHER): Payer: Self-pay | Admitting: Surgery

## 2017-08-27 ENCOUNTER — Encounter: Payer: Self-pay | Admitting: Surgery

## 2017-08-27 VITALS — BP 150/99 | HR 96 | Temp 98.2°F | Ht 64.0 in | Wt 280.6 lb

## 2017-08-27 DIAGNOSIS — K5720 Diverticulitis of large intestine with perforation and abscess without bleeding: Secondary | ICD-10-CM

## 2017-08-27 NOTE — Patient Instructions (Signed)
Please see your follow up appointment listed below.  °

## 2017-08-27 NOTE — Progress Notes (Signed)
Surgical Clinic Progress/Follow-up Note   HPI:  37 y.o. Female presents to clinic for post-op follow-up 2 weeks s/p open sigmoid colectomy with primary linear stapled anastomosis Meredith Myers(Davis, 08/11/2017). Patient reports complete resolution of pre-operative pain and has been tolerating regular diet with +flatus and normal BM's, denies N/V, fever/chills, CP, or SOB. Her only complaint is a small amount of peri-umbilical pain between cutaneous folds, between which patient has been applying a dry towel/gauze to minimize accumulation of moisture.  Review of Systems:  Constitutional: denies fever/chills  Respiratory: denies shortness of breath, wheezing  Cardiovascular: denies chest pain, palpitations  Gastrointestinal: abdominal pain, N/V, and bowel function as per interval history Skin: Denies any other rashes or skin discolorations except post-surgical wounds as per interval history  Vital Signs:  BP (!) 150/99   Pulse 96   Temp 98.2 F (36.8 C) (Oral)   Ht 5\' 4"  (1.626 m)   Wt 280 lb 9.6 oz (127.3 kg)   BMI 48.16 kg/m    Physical Exam:  Constitutional:  -- Obese body habitus  -- Awake, alert, and oriented x3  Pulmonary:  -- No crackles -- Equal breath sounds bilaterally -- Breathing non-labored at rest Cardiovascular:  -- S1, S2 present  -- No pericardial rubs  Gastrointestinal:  -- Soft and non-distended, non-tender and lower and upper aspects of vertical midline wound, but moderately tender along somewhat excoriated non-erythematous peri-umbilical wound between cutaneous folds and although well-approximated, less well-everted and less completely healed than the rest of her wound, no drainage, no guarding/rebound tenderness -- No abdominal masses appreciated, pulsatile or otherwise  Musculoskeletal / Integumentary:  -- Wounds or skin discoloration: None appreciated except post-surgical incisions as described above (GI) -- Extremities: B/L UE and LE FROM, hands and feet  warm  Assessment:  37 y.o. yo Female with a problem list including...  Patient Active Problem List   Diagnosis Date Noted  . Abscess of sigmoid colon due to diverticulitis 08/11/2017  . Diverticulitis of large intestine with abscess 07/16/2017  . Diverticulitis of colon (without mention of hemorrhage)(562.11)   . Abdominal pain   . Diverticulitis 05/04/2017    presents to clinic for post-op follow-up evaluation, doing overall very well 2 weeks s/p elective open sigmoid colectomy for persistent sigmoid colonic diverticulitis with intra-mural abscess Meredith Myers(Davis, 08/18/2017) .  Plan:              - advance diet as tolerated  - pathology results discussed  - most staples removed, steri-striips applied             - continue to shower, but do not submerge incisions under next week             - gradually resume all activities without restrictions over next 4 weeks             - apply sunblock particularly to incisions with sun exposure to reduce pigmentation of scars             - return to clinic next week for mid-wound reassessment and to remove few remaining staples at site of mild-/moderate- interfold moisture-associated excoriation and pain, instructed to call office if any questions or concerns  All of the above recommendations were discussed with the patient, and all of patient's questions were answered to her expressed satisfaction.  -- Scherrie GerlachJason E. Meredith Plateravis, MD, RPVI [place]: [facility] General Surgery - Partnering for exceptional care. Office: [phone]

## 2017-08-27 NOTE — Telephone Encounter (Signed)
FMLA paperwork completed and faxed at this time. Patient notified and will pick up original copy on Monday.

## 2017-09-02 ENCOUNTER — Ambulatory Visit (INDEPENDENT_AMBULATORY_CARE_PROVIDER_SITE_OTHER): Payer: Self-pay | Admitting: Surgery

## 2017-09-02 ENCOUNTER — Encounter: Payer: Self-pay | Admitting: Surgery

## 2017-09-02 VITALS — BP 141/97 | HR 87 | Temp 97.7°F | Ht 64.0 in | Wt 282.0 lb

## 2017-09-02 DIAGNOSIS — Z4889 Encounter for other specified surgical aftercare: Secondary | ICD-10-CM

## 2017-09-02 DIAGNOSIS — K5732 Diverticulitis of large intestine without perforation or abscess without bleeding: Secondary | ICD-10-CM

## 2017-09-02 NOTE — Progress Notes (Signed)
Surgical Clinic Progress/Follow-up Note   HPI:  37 y.o. Female presents to clinic for subsequent post-op follow-up and removal of remaining surgical skin staples 3 weeks s/p open sigmoid colectomy with primary linear stapled anastomosis Earlene Plater(Sherilynn Dieu, 08/11/2017). Patient continues to report she's been feeling and doing well with minimal peri-incisional pain and tolerating regular diet with +flatus and normal BM's, denies N/V, fever/chills, CP, or SOB.  Review of Systems:  Constitutional: denies fever/chills  Respiratory: denies shortness of breath, wheezing  Cardiovascular: denies chest pain, palpitations  Gastrointestinal: abdominal pain, N/V, and bowel function as per interval history Skin: Denies any other rashes or skin discolorations except post-surgical wounds as per interval history  Vital Signs:  BP (!) 141/97   Pulse 87   Temp 97.7 F (36.5 C) (Oral)   Ht 5\' 4"  (1.626 m)   Wt 282 lb (127.9 kg)   BMI 48.41 kg/m    Physical Exam:  Constitutional:  -- Obese body habitus  -- Awake, alert, and oriented x3  Pulmonary:  -- No crackles -- Equal breath sounds bilaterally -- Breathing non-labored at rest Cardiovascular:  -- S1, S2 present  -- No pericardial rubs  Gastrointestinal:  -- Soft and non-distended, non-tender to palpation, no guarding/rebound tenderness -- Post-surgical incisions all well-approximated without any peri-incisional erythema or drainage, few remaining staples -- No abdominal masses appreciated, pulsatile or otherwise  Musculoskeletal / Integumentary:  -- Wounds or skin discoloration: None appreciated except post-surgical incisions as described above (GI) -- Extremities: B/L UE and LE FROM, hands and feet warm   Assessment:  37 y.o. yo Female with a problem list including...  Patient Active Problem List   Diagnosis Date Noted  . Morbid obesity (HCC) 08/27/2017  . Diverticulitis of colon (without mention of hemorrhage)(562.11)     presents to clinic  for subsequent post-op follow-up evaluation, doing well 3 weeks s/p open sigmoid colectomy with primary linear stapled anastomosis Earlene Plater(Brittni Hult, 08/11/2017) for sigmoid colonic diverticulitis with intra-mural abscess Earlene Plater(Kiosha Buchan, 08/18/2017).  Plan:              - advance diet as tolerated  - remaining surgical skin staples removed, steri-strips applied             - okay to submerge incisions under water (baths, swimming) prn  - patient again applauded for weight loss efforts and positive lifestyle changes             - gradually resume activities without restrictions over next 3 weeks (no heavy lifting >40 lbs x 3 weeks)             - apply sunblock particularly to incisions with sun exposure to reduce pigmentation of scars             - return to clinic as needed, instructed to call office if any questions or concerns  All of the above recommendations were discussed with the patient, and all of patient's questions were answered to her expressed satisfaction.  -- Scherrie GerlachJason E. Earlene Plateravis, MD, RPVI [place]: [facility] General Surgery - Partnering for exceptional care. Office: [phone]

## 2017-09-02 NOTE — Patient Instructions (Signed)

## 2017-09-22 ENCOUNTER — Telehealth: Payer: Self-pay | Admitting: Surgery

## 2017-09-22 NOTE — Telephone Encounter (Signed)
I l/m for pt to let her know we had a copy of her FMLA paperwork and wanted to see if she was coming to pick it up. [facility]

## 2018-07-30 IMAGING — CT CT ABD-PELV W/ CM
2 of 4 series · 16 of 46 positions shown, 18 images · IV contrast (APPLIED)
Comparison: 05/04/2017 CT abdomen/pelvis.

CLINICAL DATA: Follow-up sigmoid diverticulitis on interval
antibiotic therapy. Persistent left lower quadrant pain.

EXAM:
CT ABDOMEN AND PELVIS WITH CONTRAST
TECHNIQUE: Multidetector CT imaging of the abdomen and pelvis was performed
using the standard protocol following bolus administration of
intravenous contrast.
CONTRAST:  125mL [id number]-ZII IOPAMIDOL ([id number]) INJECTION 61%

[Series 2: routine abd/pel with · axial · 0.90mm/px · z∈[-907,-482]mm · 13 of 95 slices shown, 15 images]
[im 5/95  soft-tissue]
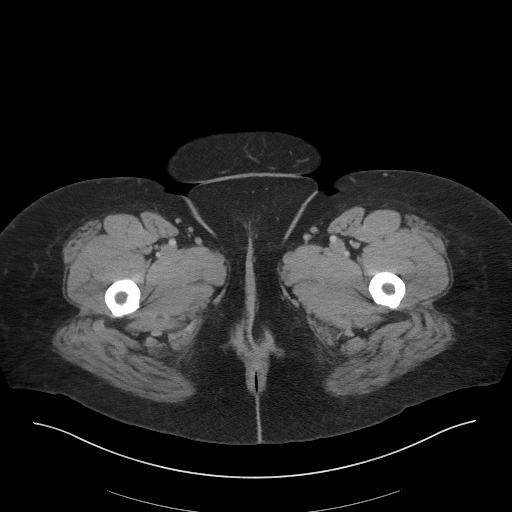
[im 5/95  bone]
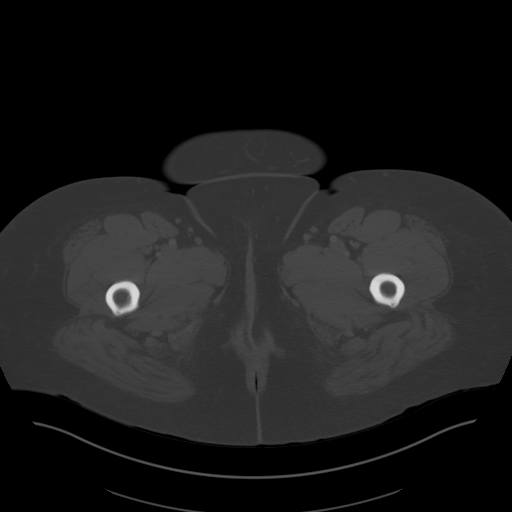
[im 13/95  soft-tissue]
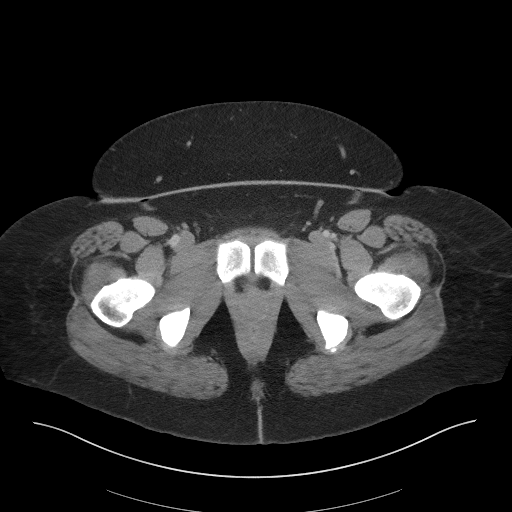
[im 22/95  soft-tissue]
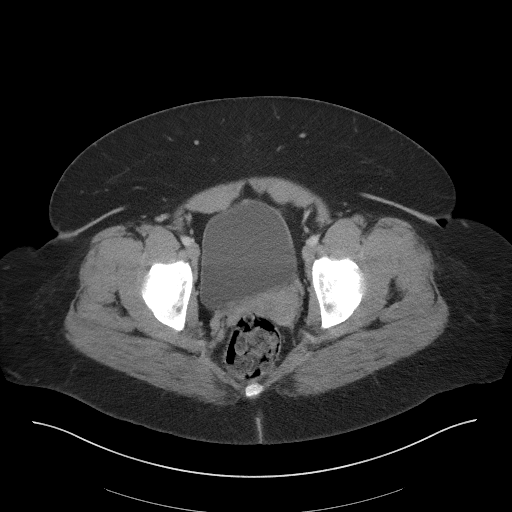
[im 26/95  soft-tissue]
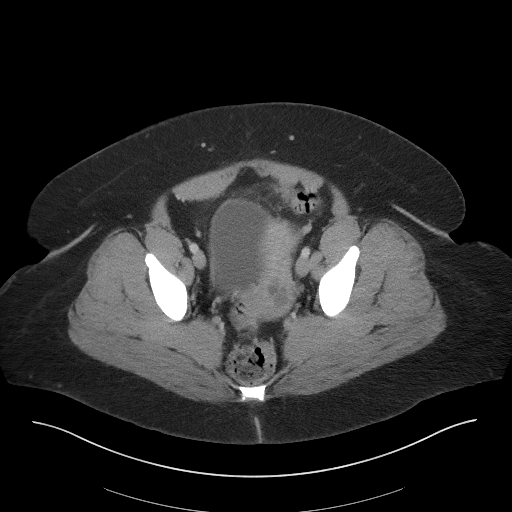
[im 35/95  soft-tissue]
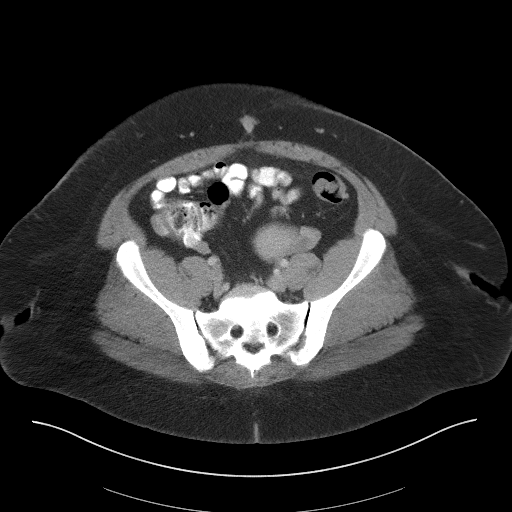
[im 39/95  soft-tissue]
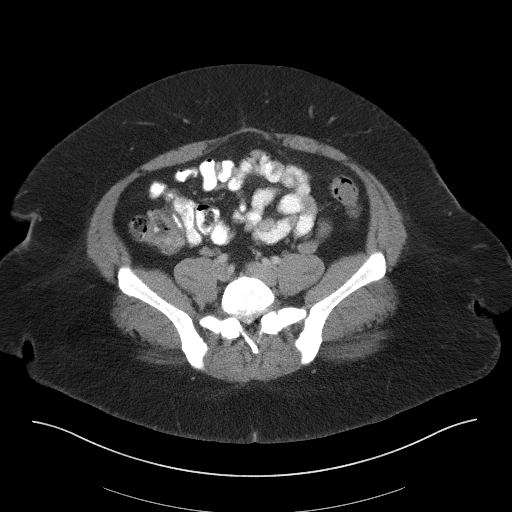
[im 48/95  soft-tissue]
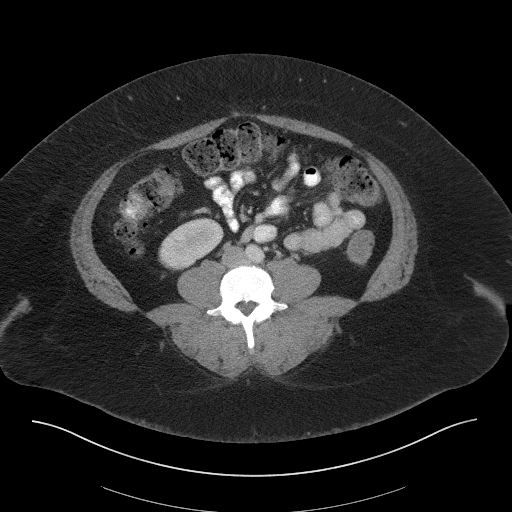
[im 56/95  soft-tissue]
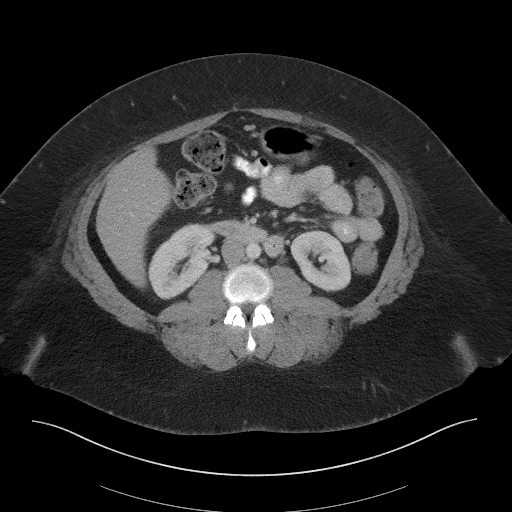
[im 60/95  soft-tissue]
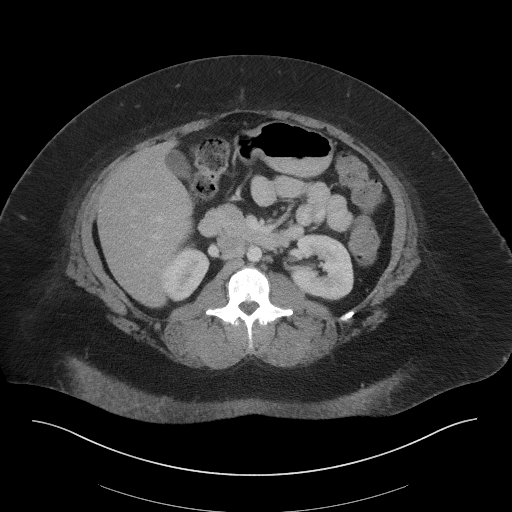
[im 60/95  bone]
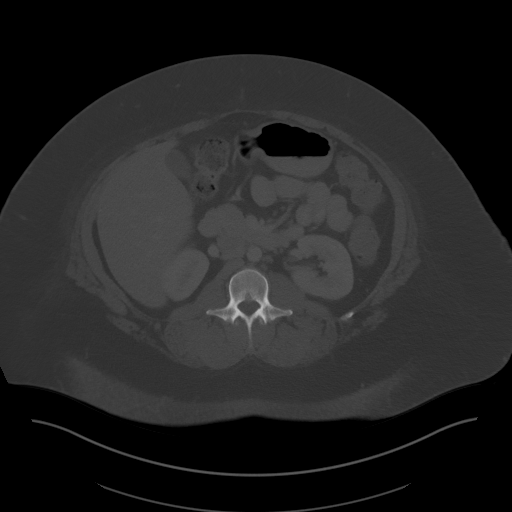
[im 69/95  soft-tissue]
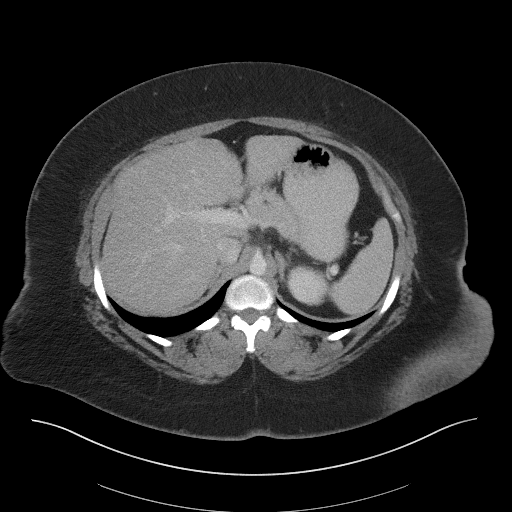
[im 73/95  soft-tissue]
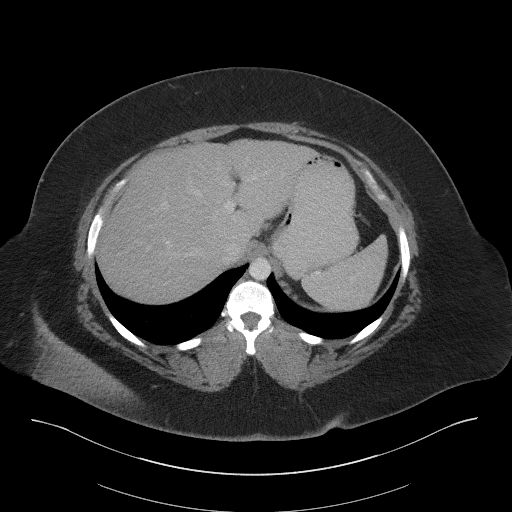
[im 82/95  soft-tissue]
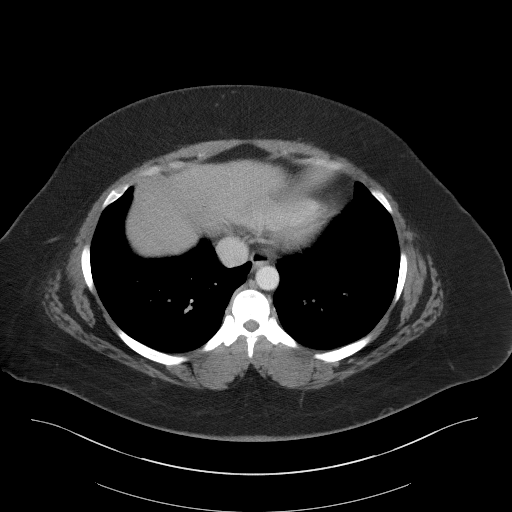
[im 90/95  soft-tissue]
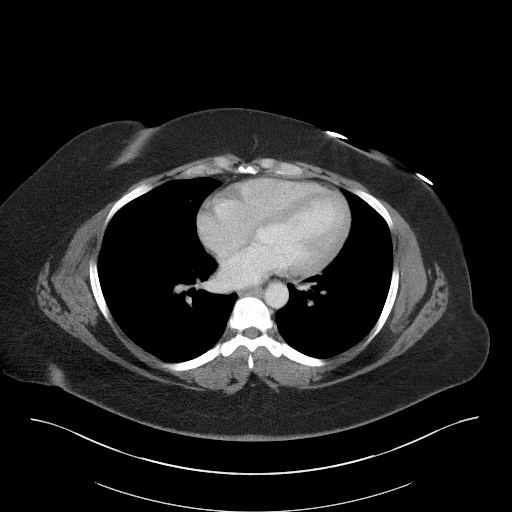

[Series 5: coronal st · coronal · 0.86mm/px · 3 of 96 slices shown]
[im 32/96  soft-tissue]
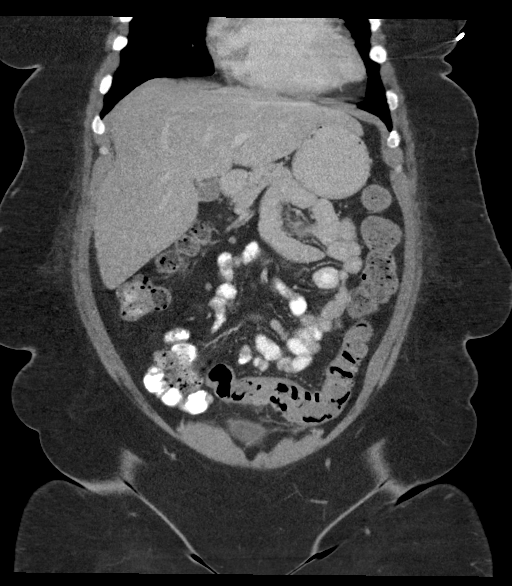
[im 43/96  soft-tissue]
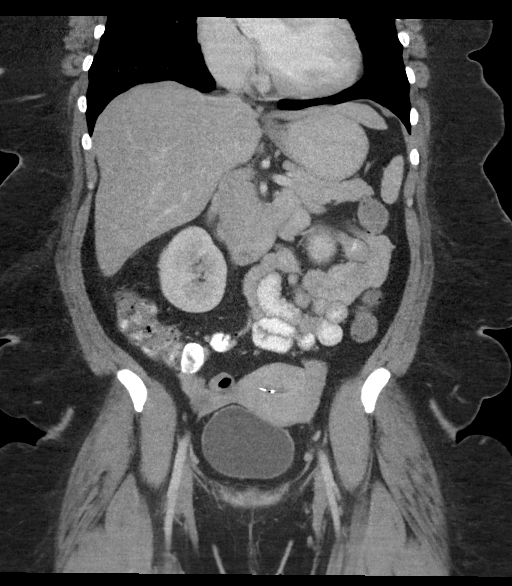
[im 53/96  soft-tissue]
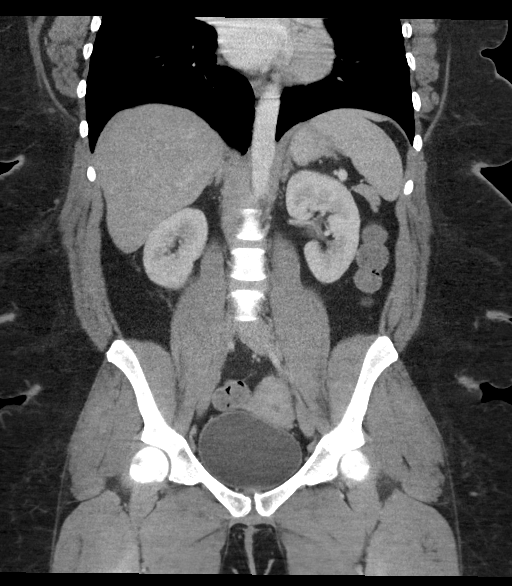

[16 of 46 positions shown; findings below may reference images not displayed]

FINDINGS: Lower chest: No significant pulmonary nodules or acute consolidative
airspace disease.

Hepatobiliary: Diffuse hepatic steatosis. No definite liver surface
irregularity. No liver masses. Normal gallbladder with no radiopaque
cholelithiasis. No biliary ductal dilatation.

Pancreas: Normal, with no mass or duct dilation.

Spleen: Normal size. No mass.

Adrenals/Urinary Tract: Normal adrenals. Normal kidneys with no
hydronephrosis and no renal mass. Normal bladder.

Stomach/Bowel: Normal non-distended stomach. Normal caliber small
bowel with no small bowel wall thickening. Normal appendix. Mild
sigmoid diverticulosis. Mild segmental wall thickening in the
proximal sigmoid colon is decreased. Associated mild pericolonic fat
stranding is decreased. There is a persistent 2.0 x 1.5 x 0.8 cm
intramural gas collection at the site of diverticulitis in the
proximal sigmoid colon (series 2/image 67), previously 1.9 x 1.2 x
0.8 cm, not appreciably changed. No drainable pericolonic fluid
collection. No additional sites of large bowel wall thickening.

Vascular/Lymphatic: Normal caliber abdominal aorta. Patent portal,
splenic, hepatic and renal veins. No pathologically enlarged lymph
nodes in the abdomen or pelvis.

Reproductive: Bilateral Essure microinserts are present in the upper
uterus, the exact positioning of which cannot be assessed by CT.
Hypodense 2.9 cm right adnexal structure (series 2/image 67) with
density 30 HU. No left adnexal mass.

Other: No pneumoperitoneum, ascites or focal fluid collection.

Musculoskeletal: No aggressive appearing focal osseous lesions. Mild
degenerative disc disease at L5-S1.
IMPRESSION: 1. Improving inflammatory changes of diverticulitis in the sigmoid
colon, however with persistent small intramural gas collection at
the site of diverticulitis, which has not changed in size. No free
intraperitoneal air. No drainable fluid collection.
2. Low-attenuation (but not simple fluid density) 2.9 cm right
adnexal structure, probably a hemorrhagic right ovarian cyst.
Follow-up transabdominal/transvaginal pelvic ultrasound advised in
6-12 weeks.
3. Diffuse hepatic steatosis.

These results will be called to the ordering clinician or
representative by the [HOSPITAL] at the imaging location.

## 2019-12-04 ENCOUNTER — Other Ambulatory Visit: Payer: Self-pay

## 2019-12-04 ENCOUNTER — Ambulatory Visit: Payer: Self-pay | Admitting: *Deleted

## 2019-12-04 ENCOUNTER — Telehealth: Payer: Self-pay | Admitting: Medical

## 2019-12-04 ENCOUNTER — Encounter: Payer: Self-pay | Admitting: Medical

## 2019-12-04 DIAGNOSIS — Z20822 Contact with and (suspected) exposure to covid-19: Secondary | ICD-10-CM

## 2019-12-04 DIAGNOSIS — J3489 Other specified disorders of nose and nasal sinuses: Secondary | ICD-10-CM

## 2019-12-04 LAB — POC COVID19 BINAXNOW: SARS Coronavirus 2 Ag: NEGATIVE

## 2019-12-04 NOTE — Progress Notes (Signed)
° °  Subjective:    Patient ID: Meredith Myers, female    DOB: 05/17/1980, 39 y.o.   MRN: [id number]  HPI 39 yo female in non acute distress, consents to telemedicine appointment. Started with runny nose yesterday. Daughter tested positive  Sunday early morning.(close contact). Lives with Daughter (29 yo).  Not vaccinated.   Allergies  Allergen Reactions   Lactose Intolerance (Gi) Diarrhea    Gas,bloating    Review of Systems  Constitutional: Negative for chills and fever.  HENT: Positive for congestion and rhinorrhea. Negative for postnasal drip and sore throat.   Respiratory: Negative for cough and shortness of breath.   Cardiovascular: Negative for chest pain.  Gastrointestinal: Negative for abdominal pain, nausea and vomiting.  Musculoskeletal: Negative for myalgias.  Neurological: Negative for dizziness, syncope and headaches.   No loss of taste or smell.    Objective:   Physical Exam AXOX3 No physical exam was performed due to telemedicine appointment.  Recent Results (from the past 2160 hour(s))  POC COVID-19     Status: Normal   Collection Time: 12/04/19  2:42 PM  Result Value Ref Range   SARS Coronavirus 2 Ag Negative Negative    Comment: Pt aware. Virtual visit completed with H.Yordy Matton PAC.    Pending PCR Covid-19 test      Assessment & Plan:  Rhinorhea, and  Close contact  exposure Pending PCR Covid-19 test Patients last exposure to her daughter 12/01/19 when daughter tested positive. She is to stay home for  14 days, her next work day would be 12/17/2019. Reviewed S/S of Covid-19 any new symptoms another testwill need to  be done. Patient verbalizes understanding and has no questions at the end of our conversation.

## 2019-12-06 LAB — SARS-COV-2, NAA 2 DAY TAT

## 2019-12-06 LAB — NOVEL CORONAVIRUS, NAA: SARS-CoV-2, NAA: NOT DETECTED

## 2023-11-28 ENCOUNTER — Emergency Department: Payer: Self-pay

## 2023-11-28 ENCOUNTER — Other Ambulatory Visit: Payer: Self-pay

## 2023-11-28 ENCOUNTER — Emergency Department
Admission: EM | Admit: 2023-11-28 | Discharge: 2023-11-28 | Disposition: A | Discharge status: Home / Self Care | Payer: Self-pay | Attending: Emergency Medicine | Admitting: Emergency Medicine

## 2023-11-28 DIAGNOSIS — I10 Essential (primary) hypertension: Secondary | ICD-10-CM | POA: Insufficient documentation

## 2023-11-28 DIAGNOSIS — R42 Dizziness and giddiness: Secondary | ICD-10-CM

## 2023-11-28 LAB — BASIC METABOLIC PANEL WITH GFR
Anion gap: 9 (ref 5–15)
BUN: 20 mg/dL (ref 6–20)
CO2: 25 mmol/L (ref 22–32)
Calcium: 9.2 mg/dL (ref 8.9–10.3)
Chloride: 101 mmol/L (ref 98–111)
Creatinine, Ser: 1.12 mg/dL — ABNORMAL HIGH (ref 0.44–1.00)
GFR, Estimated: >60 mL/min (ref >60)
Glucose, Bld: 110 mg/dL — ABNORMAL HIGH (ref 70–99)
Potassium: 3.9 mmol/L (ref 3.5–5.1)
Sodium: 135 mmol/L (ref 135–145)

## 2023-11-28 LAB — CBC
HCT: 42.5 % (ref 36.0–46.0)
Hemoglobin: 14 g/dL (ref 12.0–15.0)
MCH: 28.5 pg (ref 26.0–34.0)
MCHC: 32.9 g/dL (ref 30.0–36.0)
MCV: 86.4 fL (ref 80.0–100.0)
Platelets: 328 K/uL (ref 150–400)
RBC: 4.92 MIL/uL (ref 3.87–5.11)
RDW: 14.2 % (ref 11.5–15.5)
WBC: 6.2 K/uL (ref 4.0–10.5)
nRBC: 0 % (ref 0.0–0.2)

## 2023-11-28 LAB — POC URINE PREG, ED: Preg Test, Ur: NEGATIVE

## 2023-11-28 LAB — TROPONIN I (HIGH SENSITIVITY): Troponin I (High Sensitivity): 4 ng/L (ref <18)

## 2023-11-28 MED ORDER — AMLODIPINE BESYLATE 5 MG PO TABS: 5.0000 mg | ORAL_TABLET | Freq: Every day | ORAL | 2 refills | Status: AC

## 2023-11-28 MED ORDER — AMLODIPINE BESYLATE 5 MG PO TABS
5.0000 mg | ORAL_TABLET | Freq: Once | ORAL | Status: AC
  Administered 2023-11-28: 5 mg via ORAL
  Filled 2023-11-28: qty 1

## 2023-11-28 NOTE — ED Triage Notes (Signed)
 Patient states hypertension since last week after being diagnosed for dental infection. Patient complaining of dizziness and generally not feeling well.

## 2023-11-28 NOTE — ED Notes (Signed)
 Pt up and attemping to use bathroom.

## 2023-11-28 NOTE — ED Provider Notes (Signed)
 [facility] Provider Note    Event Date/Time   First MD Initiated Contact with Patient 11/28/23 1923     (approximate)   History   Chief Complaint Hypertension   HPI  Meredith Myers is a 43 y.o. female with past medical history of hypertension who presents to the ED complaining of hypertension.  Patient reports that earlier this week she was told that her blood pressure was elevated when she went for a dentist appointment.  She reports previously being told that she has high blood pressure, but is not currently taking medication for it.  She had scheduled an appointment to see her PCP tomorrow, but states she felt increasingly dizzy and lightheaded over the past 24 hours.  She decided to seek care in the ED, denies any chest pain or shortness of breath.  She denies any vision changes, speech changes, numbness, or weakness.     Physical Exam   Triage Vital Signs: ED Triage Vitals  Encounter Vitals Group     BP 11/28/23 1704 (!) 194/145     Girls Systolic BP Percentile --      Girls Diastolic BP Percentile --      Boys Systolic BP Percentile --      Boys Diastolic BP Percentile --      Pulse Rate 11/28/23 1704 95     Resp 11/28/23 1704 20     Temp 11/28/23 1704 98.5 F (36.9 C)     Temp Source 11/28/23 1704 Oral     SpO2 11/28/23 1704 93 %     Weight 11/28/23 1705 (!) 304 lb (137.9 kg)     Height 11/28/23 1705 5' 3 (1.6 m)     Head Circumference --      Peak Flow --      Pain Score 11/28/23 1705 0     Pain Loc --      Pain Education --      Exclude from Growth Chart --     Most recent vital signs: Vitals:   11/28/23 1935 11/28/23 1936  BP: (!) 207/107 (!) 207/107  Pulse:  100  Resp:  16  Temp:  98.7 F (37.1 C)  SpO2:  100%    Constitutional: Alert and oriented. Eyes: Conjunctivae are normal. Head: Atraumatic. Nose: No congestion/rhinnorhea. Mouth/Throat: Mucous membranes are moist.  Cardiovascular: Normal rate, regular rhythm.  Grossly normal heart sounds.  2+ radial pulses bilaterally. Respiratory: Normal respiratory effort.  No retractions. Lungs CTAB. Gastrointestinal: Soft and nontender. No distention. Musculoskeletal: No lower extremity tenderness nor edema.  Neurologic:  Normal speech and language. No gross focal neurologic deficits are appreciated.    ED Results / Procedures / Treatments   Labs (all labs ordered are listed, but only abnormal results are displayed) Labs Reviewed  BASIC METABOLIC PANEL WITH GFR - Abnormal; Notable for the following components:      Result Value   Glucose, Bld 110 (*)    Creatinine, Ser 1.12 (*)    All other components within normal limits  CBC  POC URINE PREG, ED  TROPONIN I (HIGH SENSITIVITY)     EKG  ED ECG REPORT I, Carlin Palin, the attending physician, personally viewed and interpreted this ECG.   Date: 11/28/2023  EKG Time: 17:07  Rate: 98  Rhythm: normal sinus rhythm  Axis: Normal  Intervals:none  ST&T Change: None  RADIOLOGY Chest x-ray reviewed and interpreted by me with no infiltrate, edema, or effusion.  PROCEDURES:  Critical Care performed:  No  Procedures   MEDICATIONS ORDERED IN ED: Medications  amLODipine  (NORVASC ) tablet 5 mg (has no administration in time range)     IMPRESSION / MDM / ASSESSMENT AND PLAN / ED COURSE  I reviewed the triage vital signs and the nursing notes.                              43 y.o. female with past medical history of hypertension who presents to the ED complaining of dizziness and lightheadedness over the past 24 hours with recently elevated blood pressure.  Patient's presentation is most consistent with acute presentation with potential threat to life or bodily function.  Differential diagnosis includes, but is not limited to, stroke, TIA, AKI, anemia, electrolyte abnormality, arrhythmia, ACS.  Patient nontoxic-appearing and in no acute distress, vital signs remarkable for hypertension but  otherwise reassuring.  EKG shows no evidence of arrhythmia or ischemia, troponin within normal limits.  Additional labs reassuring with no significant anemia, leukocytosis, electrolyte abnormality, or AKI.  No evidence of hypertensive emergency at this time as patient has a nonfocal neurologic exam, dizziness described as lightheadedness and I doubt stroke.  She is appropriate for outpatient management, we will start her on amlodipine  and she was counseled to return to the ED for new or worsening symptoms.  Patient agrees with plan.      FINAL CLINICAL IMPRESSION(S) / ED DIAGNOSES   Final diagnoses:  Uncontrolled hypertension  Lightheadedness     Rx / DC Orders   ED Discharge Orders          Ordered    amLODipine  (NORVASC ) 5 MG tablet  Daily        11/28/23 2000             Note:  This document was prepared using Dragon voice recognition software and may include unintentional dictation errors.   Willo Dunnings, MD 11/28/23 2003
